# Patient Record
Sex: Female | Born: 1990
Health system: Southern US, Community
[De-identification: ages and names within clinical notes are randomized; demographics above are authoritative.]

## PROBLEM LIST (undated history)

## (undated) ENCOUNTER — Inpatient Hospital Stay (HOSPITAL_COMMUNITY): Payer: Self-pay

## (undated) DIAGNOSIS — F32A Depression, unspecified: Secondary | ICD-10-CM

## (undated) DIAGNOSIS — F419 Anxiety disorder, unspecified: Secondary | ICD-10-CM

## (undated) HISTORY — PX: WISDOM TOOTH EXTRACTION: SHX21

---

## 2015-06-03 HISTORY — PX: WISDOM TOOTH EXTRACTION: SHX21

## 2019-05-09 DIAGNOSIS — F4323 Adjustment disorder with mixed anxiety and depressed mood: Secondary | ICD-10-CM | POA: Diagnosis not present

## 2019-12-14 ENCOUNTER — Ambulatory Visit
Admission: EM | Admit: 2019-12-14 | Discharge: 2019-12-14 | Disposition: A | Discharge status: Home / Self Care | Payer: BC Managed Care – PPO | Attending: Physician Assistant | Admitting: Physician Assistant

## 2019-12-14 DIAGNOSIS — R21 Rash and other nonspecific skin eruption: Secondary | ICD-10-CM

## 2019-12-14 DIAGNOSIS — T63441A Toxic effect of venom of bees, accidental (unintentional), initial encounter: Secondary | ICD-10-CM

## 2019-12-14 MED ORDER — DEXAMETHASONE 4 MG PO TABS
4.0000 mg | ORAL_TABLET | Freq: Every day | ORAL | 0 refills | Status: DC
Start: 2019-12-14 — End: 2020-10-08

## 2019-12-14 NOTE — Discharge Instructions (Signed)
Ibuprofen 800mg  three times a day. Decadron pills as directed. Ice compress. Monitor for signs of infection including spreading redness, warmth, fever.

## 2019-12-14 NOTE — ED Provider Notes (Signed)
[facility]    CSN: [id number] Arrival date & time: 12/14/19  0851      History   Chief Complaint Chief Complaint  Patient presents with   Insect Bite    HPI Barbara Whitehead is a 29 y.o. female.   29 year old female comes in for multiple bee stings occurring yesterday. Was mowing the lawn when she was stung by multiple bees to the left wrist and bilateral ankles. Has pain, swelling, itching. Antihistamine, NSAIDs with some relief.      History reviewed. No pertinent past medical history.  There are no problems to display for this patient.   Past Surgical History:  Procedure Laterality Date   WISDOM TOOTH EXTRACTION      OB History   No obstetric history on file.      Home Medications    Prior to Admission medications   Medication Sig Start Date End Date Taking? Authorizing Provider  dexamethasone (DECADRON) 4 MG tablet Take 1 tablet (4 mg total) by mouth daily. 12/14/19   Belinda Fisher, PA-C    Family History History reviewed. No pertinent family history.  Social History Social History   Tobacco Use   Smoking status: Never Smoker   Smokeless tobacco: Never Used  Substance Use Topics   Alcohol use: Yes    Comment: social   Drug use: Not Currently     Allergies   Patient has no known allergies.   Review of Systems Review of Systems  Reason unable to perform ROS: See HPI as above.     Physical Exam Triage Vital Signs ED Triage Vitals [12/14/19 0903]  Enc Vitals Group     BP 123/61     Pulse Rate 86     Resp 18     Temp 98.9 F (37.2 C)     Temp Source Oral     SpO2 99 %     Weight      Height      Head Circumference      Peak Flow      Pain Score 5     Pain Loc      Pain Edu?      Excl. in GC?    No data found.  Updated Vital Signs BP 123/61 (BP Location: Left Arm)    Pulse 86    Temp 98.9 F (37.2 C) (Oral)    Resp 18    LMP 12/13/2019    SpO2 99%   Physical Exam Constitutional:      General: She is not in  acute distress.    Appearance: Normal appearance. She is well-developed. She is not toxic-appearing or diaphoretic.  HENT:     Head: Normocephalic and atraumatic.  Eyes:     Conjunctiva/sclera: Conjunctivae normal.     Pupils: Pupils are equal, round, and reactive to light.  Pulmonary:     Effort: Pulmonary effort is normal. No respiratory distress.     Comments: Speaking in full sentences without difficulty Musculoskeletal:     Cervical back: Normal range of motion and neck supple.     Comments: Swelling to the left wrist, bilateral ankles with warmth. No erythema. Full ROM of extremities. NVI  Skin:    General: Skin is warm and dry.  Neurological:     Mental Status: She is alert and oriented to person, place, and time.      UC Treatments / Results  Labs (all labs ordered are listed, but only abnormal results are  displayed) Labs Reviewed - No data to display  EKG   Radiology No results found.  Procedures Procedures (including critical care time)  Medications Ordered in UC Medications - No data to display  Initial Impression / Assessment and Plan / UC Course  I have reviewed the triage vital signs and the nursing notes.  Pertinent labs & imaging results that were available during my care of the patient were reviewed by me and considered in my medical decision making (see chart for details).     Will provide corticosteroid for symptomatic management. Patient had significant side effects with prednisone, will try decadron, can discontinue if still with significant side effects. Continue other symptomatic management. Return precautions given.  Final Clinical Impressions(s) / UC Diagnoses   Final diagnoses:  Rash  Bee sting, accidental or unintentional, initial encounter    ED Prescriptions    Medication Sig Dispense Auth. Provider   dexamethasone (DECADRON) 4 MG tablet Take 1 tablet (4 mg total) by mouth daily. 5 tablet Belinda Fisher, PA-C     PDMP not reviewed this  encounter.   Belinda Fisher, PA-C 12/14/19 [phone]

## 2019-12-14 NOTE — ED Triage Notes (Signed)
Pt states had 7 bee stings to lt wrist and bilateral ankles while mowing yesterday evening around 7pm. States has had a total of 3 benadryl with last one at 8pm, has had total of 4 ibuprofen with last dosage this am.

## 2020-02-15 DIAGNOSIS — Z03818 Encounter for observation for suspected exposure to other biological agents ruled out: Secondary | ICD-10-CM | POA: Diagnosis not present

## 2020-03-05 DIAGNOSIS — Z03818 Encounter for observation for suspected exposure to other biological agents ruled out: Secondary | ICD-10-CM | POA: Diagnosis not present

## 2020-05-22 DIAGNOSIS — F4323 Adjustment disorder with mixed anxiety and depressed mood: Secondary | ICD-10-CM | POA: Diagnosis not present

## 2020-06-05 DIAGNOSIS — F4323 Adjustment disorder with mixed anxiety and depressed mood: Secondary | ICD-10-CM | POA: Diagnosis not present

## 2020-06-26 DIAGNOSIS — F4323 Adjustment disorder with mixed anxiety and depressed mood: Secondary | ICD-10-CM | POA: Diagnosis not present

## 2020-07-03 DIAGNOSIS — F4323 Adjustment disorder with mixed anxiety and depressed mood: Secondary | ICD-10-CM | POA: Diagnosis not present

## 2020-07-10 DIAGNOSIS — Z23 Encounter for immunization: Secondary | ICD-10-CM | POA: Diagnosis not present

## 2020-07-10 DIAGNOSIS — Z0000 Encounter for general adult medical examination without abnormal findings: Secondary | ICD-10-CM | POA: Diagnosis not present

## 2020-07-11 DIAGNOSIS — F4323 Adjustment disorder with mixed anxiety and depressed mood: Secondary | ICD-10-CM | POA: Diagnosis not present

## 2020-07-16 DIAGNOSIS — F4323 Adjustment disorder with mixed anxiety and depressed mood: Secondary | ICD-10-CM | POA: Diagnosis not present

## 2020-07-17 DIAGNOSIS — Z0000 Encounter for general adult medical examination without abnormal findings: Secondary | ICD-10-CM | POA: Diagnosis not present

## 2020-07-17 DIAGNOSIS — Z13220 Encounter for screening for lipoid disorders: Secondary | ICD-10-CM | POA: Diagnosis not present

## 2020-07-18 DIAGNOSIS — M9903 Segmental and somatic dysfunction of lumbar region: Secondary | ICD-10-CM | POA: Diagnosis not present

## 2020-07-18 DIAGNOSIS — M9902 Segmental and somatic dysfunction of thoracic region: Secondary | ICD-10-CM | POA: Diagnosis not present

## 2020-07-18 DIAGNOSIS — M62830 Muscle spasm of back: Secondary | ICD-10-CM | POA: Diagnosis not present

## 2020-07-18 DIAGNOSIS — M9905 Segmental and somatic dysfunction of pelvic region: Secondary | ICD-10-CM | POA: Diagnosis not present

## 2020-07-24 DIAGNOSIS — M62830 Muscle spasm of back: Secondary | ICD-10-CM | POA: Diagnosis not present

## 2020-07-24 DIAGNOSIS — M9903 Segmental and somatic dysfunction of lumbar region: Secondary | ICD-10-CM | POA: Diagnosis not present

## 2020-07-24 DIAGNOSIS — M9905 Segmental and somatic dysfunction of pelvic region: Secondary | ICD-10-CM | POA: Diagnosis not present

## 2020-07-24 DIAGNOSIS — F4323 Adjustment disorder with mixed anxiety and depressed mood: Secondary | ICD-10-CM | POA: Diagnosis not present

## 2020-07-24 DIAGNOSIS — M9902 Segmental and somatic dysfunction of thoracic region: Secondary | ICD-10-CM | POA: Diagnosis not present

## 2020-07-31 DIAGNOSIS — M9902 Segmental and somatic dysfunction of thoracic region: Secondary | ICD-10-CM | POA: Diagnosis not present

## 2020-07-31 DIAGNOSIS — M9905 Segmental and somatic dysfunction of pelvic region: Secondary | ICD-10-CM | POA: Diagnosis not present

## 2020-07-31 DIAGNOSIS — M62830 Muscle spasm of back: Secondary | ICD-10-CM | POA: Diagnosis not present

## 2020-07-31 DIAGNOSIS — M9903 Segmental and somatic dysfunction of lumbar region: Secondary | ICD-10-CM | POA: Diagnosis not present

## 2020-08-01 DIAGNOSIS — F4323 Adjustment disorder with mixed anxiety and depressed mood: Secondary | ICD-10-CM | POA: Diagnosis not present

## 2020-08-07 DIAGNOSIS — M62830 Muscle spasm of back: Secondary | ICD-10-CM | POA: Diagnosis not present

## 2020-08-07 DIAGNOSIS — M9905 Segmental and somatic dysfunction of pelvic region: Secondary | ICD-10-CM | POA: Diagnosis not present

## 2020-08-07 DIAGNOSIS — M9903 Segmental and somatic dysfunction of lumbar region: Secondary | ICD-10-CM | POA: Diagnosis not present

## 2020-08-07 DIAGNOSIS — M9902 Segmental and somatic dysfunction of thoracic region: Secondary | ICD-10-CM | POA: Diagnosis not present

## 2020-08-14 DIAGNOSIS — M62830 Muscle spasm of back: Secondary | ICD-10-CM | POA: Diagnosis not present

## 2020-08-14 DIAGNOSIS — M9902 Segmental and somatic dysfunction of thoracic region: Secondary | ICD-10-CM | POA: Diagnosis not present

## 2020-08-14 DIAGNOSIS — M9903 Segmental and somatic dysfunction of lumbar region: Secondary | ICD-10-CM | POA: Diagnosis not present

## 2020-08-14 DIAGNOSIS — M9905 Segmental and somatic dysfunction of pelvic region: Secondary | ICD-10-CM | POA: Diagnosis not present

## 2020-08-15 DIAGNOSIS — F4323 Adjustment disorder with mixed anxiety and depressed mood: Secondary | ICD-10-CM | POA: Diagnosis not present

## 2020-08-23 DIAGNOSIS — F4323 Adjustment disorder with mixed anxiety and depressed mood: Secondary | ICD-10-CM | POA: Diagnosis not present

## 2020-09-03 DIAGNOSIS — F4323 Adjustment disorder with mixed anxiety and depressed mood: Secondary | ICD-10-CM | POA: Diagnosis not present

## 2020-09-07 DIAGNOSIS — Z03818 Encounter for observation for suspected exposure to other biological agents ruled out: Secondary | ICD-10-CM | POA: Diagnosis not present

## 2020-09-13 DIAGNOSIS — F4323 Adjustment disorder with mixed anxiety and depressed mood: Secondary | ICD-10-CM | POA: Diagnosis not present

## 2020-09-19 DIAGNOSIS — F4323 Adjustment disorder with mixed anxiety and depressed mood: Secondary | ICD-10-CM | POA: Diagnosis not present

## 2020-09-27 DIAGNOSIS — F4323 Adjustment disorder with mixed anxiety and depressed mood: Secondary | ICD-10-CM | POA: Diagnosis not present

## 2020-10-04 DIAGNOSIS — F4323 Adjustment disorder with mixed anxiety and depressed mood: Secondary | ICD-10-CM | POA: Diagnosis not present

## 2020-10-08 ENCOUNTER — Other Ambulatory Visit: Payer: Self-pay

## 2020-10-08 ENCOUNTER — Encounter (HOSPITAL_BASED_OUTPATIENT_CLINIC_OR_DEPARTMENT_OTHER): Payer: Self-pay | Admitting: Obstetrics & Gynecology

## 2020-10-08 ENCOUNTER — Ambulatory Visit (INDEPENDENT_AMBULATORY_CARE_PROVIDER_SITE_OTHER): Payer: BC Managed Care – PPO | Admitting: Obstetrics & Gynecology

## 2020-10-08 VITALS — BP 145/91 | HR 107 | Ht 64.25 in | Wt 158.0 lb

## 2020-10-08 DIAGNOSIS — N942 Vaginismus: Secondary | ICD-10-CM

## 2020-10-08 DIAGNOSIS — N941 Unspecified dyspareunia: Secondary | ICD-10-CM

## 2020-10-08 DIAGNOSIS — Z124 Encounter for screening for malignant neoplasm of cervix: Secondary | ICD-10-CM

## 2020-10-08 NOTE — Progress Notes (Signed)
30 y.o. G0P0000 Married White or Caucasian female here for annual exam.  Cycles are regular.  Started cycles in about the 7th grade.  Flow lasts 5 days.  First two days are heavy.  Does pass clots at times.  Has attempted intercourse.  No trauma that she's aware of but states she has wondered.  She can place a tampon.  Doesn't use tampons often but does if she's going to the beach or the pool and is on her cycle.   She's married for six years.  States spouse is really great.  Says several derogatory statements about herself.  When asked, states she is not interested in couples therapy at this point.    Not sure she wants to have children.    Has never had a gyn exam.  Has never had a Pap smear.  Patient's last menstrual period was 09/09/2020.          Sexually active: not currently The current method of family planning is abstinence.    Exercising: Yes.    runing and walking Smoker:  no  Health Maintenance: Pap:  never Vaccines:  Has completed the Covid vaccine.  Unsure of others.   reports that she has never smoked. She has never used smokeless tobacco. She reports current alcohol use. She reports previous drug use.  History reviewed. No pertinent past medical history.  Past Surgical History:  Procedure Laterality Date  . WISDOM TOOTH EXTRACTION  2017    No current outpatient medications on file.   No current facility-administered medications for this visit.    Family History  Problem Relation Age of Onset  . Arthritis Mother   . Depression Mother   . Sarcoidosis Father   . Depression Father     Review of Systems  All other systems reviewed and are negative.   Exam:   BP (!) 145/91   Pulse (!) 107   Ht 5' 4.25" (1.632 m)   Wt 158 lb (71.7 kg)   LMP 09/09/2020   BMI 26.91 kg/m   Height: 5' 4.25" (163.2 cm)  General appearance: alert, cooperative and appears stated age Head: Normocephalic, without obvious abnormality, atraumatic Neck: no adenopathy, supple,  symmetrical, trachea midline and thyroid normal to inspection and palpation Lungs: clear to auscultation bilaterally Breasts: normal appearance, no masses or tenderness Heart: regular rate and rhythm Abdomen: soft, non-tender; bowel sounds normal; no masses,  no organomegaly Extremities: extremities normal, atraumatic, no cyanosis or edema Skin: Skin color, texture, turgor normal. No rashes or lesions Lymph nodes: Cervical, supraclavicular, and axillary nodes normal. No abnormal inguinal nodes palpated Neurologic: Grossly normal   Pelvic: External genitalia:  no lesions              Urethra:  normal appearing urethra with no masses, tenderness or lesions              Bartholins and Skenes: normal                 Vagina: attempted placement of small speculum without success.  Pt reports significant pain and cries with this attempt.  Stopped due to her discomfort.   Declined chaperone.  Assessment/Plan: 1. Dyspareunia, female - Ambulatory referral to Physical Therapy - discussed with pt she may need a more lengthy physical therapy due to severity of her symptoms - I think she will benefit from therapy as well.  2. Vaginismus  3.  Needs updated medical care - release of records for vaccines signed today - pt  will need to return for repeat attempt at Pap smear  Total time with pt:  49 minutes

## 2020-10-09 ENCOUNTER — Encounter (HOSPITAL_BASED_OUTPATIENT_CLINIC_OR_DEPARTMENT_OTHER): Payer: Self-pay | Admitting: Obstetrics & Gynecology

## 2020-10-09 DIAGNOSIS — N941 Unspecified dyspareunia: Secondary | ICD-10-CM | POA: Insufficient documentation

## 2020-10-09 DIAGNOSIS — N942 Vaginismus: Secondary | ICD-10-CM | POA: Insufficient documentation

## 2020-10-10 ENCOUNTER — Encounter (HOSPITAL_BASED_OUTPATIENT_CLINIC_OR_DEPARTMENT_OTHER): Payer: Self-pay

## 2020-10-10 NOTE — Telephone Encounter (Signed)
Called pt personally and gave pt information about [facility], Jeanie Cooks who does couples/sex therapy, and [employer].  Pt voiced appreciation for phone call.

## 2020-10-11 ENCOUNTER — Ambulatory Visit (HOSPITAL_BASED_OUTPATIENT_CLINIC_OR_DEPARTMENT_OTHER): Payer: BC Managed Care – PPO | Admitting: Obstetrics & Gynecology

## 2020-10-12 DIAGNOSIS — Z03818 Encounter for observation for suspected exposure to other biological agents ruled out: Secondary | ICD-10-CM | POA: Diagnosis not present

## 2020-10-17 DIAGNOSIS — F4323 Adjustment disorder with mixed anxiety and depressed mood: Secondary | ICD-10-CM | POA: Diagnosis not present

## 2020-10-23 DIAGNOSIS — F419 Anxiety disorder, unspecified: Secondary | ICD-10-CM | POA: Diagnosis not present

## 2020-10-25 DIAGNOSIS — F4323 Adjustment disorder with mixed anxiety and depressed mood: Secondary | ICD-10-CM | POA: Diagnosis not present

## 2020-11-07 DIAGNOSIS — F4323 Adjustment disorder with mixed anxiety and depressed mood: Secondary | ICD-10-CM | POA: Diagnosis not present

## 2020-11-08 DIAGNOSIS — F419 Anxiety disorder, unspecified: Secondary | ICD-10-CM | POA: Diagnosis not present

## 2020-11-13 DIAGNOSIS — F419 Anxiety disorder, unspecified: Secondary | ICD-10-CM | POA: Diagnosis not present

## 2020-11-20 ENCOUNTER — Ambulatory Visit: Payer: BC Managed Care – PPO | Attending: Obstetrics & Gynecology | Admitting: Physical Therapy

## 2020-11-20 ENCOUNTER — Other Ambulatory Visit: Payer: Self-pay

## 2020-11-20 ENCOUNTER — Encounter: Payer: Self-pay | Admitting: Physical Therapy

## 2020-11-20 DIAGNOSIS — R2689 Other abnormalities of gait and mobility: Secondary | ICD-10-CM | POA: Diagnosis not present

## 2020-11-20 DIAGNOSIS — R252 Cramp and spasm: Secondary | ICD-10-CM | POA: Insufficient documentation

## 2020-11-20 DIAGNOSIS — M6281 Muscle weakness (generalized): Secondary | ICD-10-CM | POA: Insufficient documentation

## 2020-11-20 NOTE — Patient Instructions (Addendum)
  Access Code: [email or web address] URL: [email or web address] Date: 11/20/2020 Prepared by: Otelia Sergeant  Exercises Supine Diaphragmatic Breathing - 1 x daily - 7 x weekly - 2 sets - 10 reps Modified Thomas Stretch - 1 x daily - 7 x weekly - 3 sets - 10 reps Supine Piriformis Stretch Pulling Heel to Hip - 1 x daily - 7 x weekly - 3 sets - 10 reps Supine Hip Adductor Stretch - 1 x daily - 7 x weekly - 3 sets - 10 reps Seated Hamstring Stretch - 1 x daily - 7 x weekly - 3 sets - 10 reps     Moisturizers They are used in the vagina to hydrate the mucous membrane that make up the vaginal canal. Designed to keep a more normal acid balance (ph) Once placed in the vagina, it will last between two to three days.  Use 2-3 times per week at bedtime  Ingredients to avoid is glycerin and fragrance, can increase chance of infection Should not be used just before sex due to causing irritation Most are gels administered either in a tampon-shaped applicator or as a vaginal suppository. They are non-hormonal.   Types of Moisturizers(internal use)  Vitamin E vaginal suppositories- Whole foods, Amazon Moist Again Coconut oil- can break down condoms Julva- (Do no use if on Tamoxifen) amazon Yes moisturizer- amazon NeuEve Silk , NeuEve Silver for menopausal or over 65 (if have severe vaginal atrophy or cancer treatments use NeuEve Silk for  1 month than move to [employer])- [employer], [place].com Olive and Bee intimate cream- www.oliveandbee.com.au Mae vaginal moisturizer- Amazon Aloe    Creams to use externally on the Vulva area Marathon Oil (good for for cancer patients that had radiation to the area)- [place] or [employer].https://garcia-[email or web address]/ V-magic cream - amazon Julva-amazon Vital "V Wild Yam salve ( help moisturize and help with thinning vulvar area, does have Beeswax MoodMaid Botanical Pro-Meno Wild Yam Cream- Amazon Desert Harvest Gele Cleo by Zane Herald labial moisturizer (Amazon,   Coconut or olive oil aloe   Things to avoid in the vaginal area Do not use things to irritate the vulvar area No lotions just specialized creams for the vulva area- Neogyn, V-magic, No soaps; can use Aveeno or Calendula cleanser if needed. Must be gentle No deodorants No douches Good to sleep without underwear to let the vaginal area to air out No scrubbing: spread the lips to let warm water rinse over labias and pat dry

## 2020-11-20 NOTE — Therapy (Signed)
[facility] [address] [place], [place], [address] Phone: [phone]   Fax:  [phone]  Physical Therapy Evaluation  Patient Details  Name: Barbara Whitehead MRN: [id number] Date of Birth: 1990/07/13 Referring Provider (PT): Jerene Bears, MD   Encounter Date: 11/20/2020   PT End of Session - 11/20/20 0810     Visit Number 1    Number of Visits 27    Authorization Type BCBS    Progress Note Due on Visit 10    PT Start Time 0800    PT Stop Time 0844    PT Time Calculation (min) 44 min    Activity Tolerance Patient tolerated treatment well    Behavior During Therapy [facility] for tasks assessed/performed             History reviewed. No pertinent past medical history.  Past Surgical History:  Procedure Laterality Date   WISDOM TOOTH EXTRACTION  2017    There were no vitals filed for this visit.    Subjective Assessment - 11/20/20 0803     Subjective pt reports pain with vaginal pentration, and this has been going on "as long as I can remember".    Patient Stated Goals to have less pain    Currently in Pain? No/denies    Multiple Pain Sites No                [facility] PT Assessment - 11/20/20 0001       Assessment   Medical Diagnosis N94.10 (ICD-10-CM) - Dyspareunia, female. N94.2 (ICD-10-CM) - Vaginismus    Referring Provider (PT) Jerene Bears, MD    Onset Date/Surgical Date --   chronic, years   Prior Therapy none      Precautions   Precautions None      Restrictions   Weight Bearing Restrictions No      Balance Screen   Has the patient fallen in the past 6 months No      Home Environment   Living Environment Private residence    Type of Home House      Prior Function   Level of Independence Independent      Cognition   Overall Cognitive Status Within Functional Limits for tasks assessed      Sensation   Light Touch Appears Intact      Coordination   Gross Motor Movements  are Fluid and Coordinated Yes      Posture/Postural Control   Posture/Postural Control Postural limitations    Postural Limitations Rounded Shoulders      ROM / Strength   AROM / PROM / Strength AROM;PROM;Strength      AROM   Overall AROM  Deficits    Overall AROM Comments R and L hamstring, adduction and ER limited by 25% all else EFL    AROM Assessment Site Hip;Lumbar    Right/Left Hip Right;Left      PROM   Overall PROM  Within functional limits for tasks performed      Strength   Overall Strength Within functional limits for tasks performed    Strength Assessment Site Hip;Knee;Ankle;Lumbar      Flexibility   Soft Tissue Assessment /Muscle Length yes    Hamstrings limited by 25% BLE    Piriformis limited by 25% BLE      Palpation   Palpation comment tenderness noted at B bulbocavernosus, superficial transverse abd ischiocavernosus, and Rt externally assessed obturator internus      Ambulation/Gait  Ambulation/Gait Yes    Ambulation/Gait Assistance 7: Independent    Assistive device None                        Objective measurements completed on examination: See above findings.     Pelvic Floor Special Questions - 11/20/20 0001     Prior Pelvic/Prostate Exam --   pt unable to tolerate internal pelvic exam previously, n oprevious PAP Smear   Are you Pregnant or attempting pregnancy? No    Prior Pregnancies No    Currently Sexually Active No    Urinary Leakage No    Fecal incontinence No    Skin Integrity --   mild dryness noted externally   Pelvic Floor Internal Exam pt identified, patient confirms consent for PT to perform inernal soft tissue work and muscle strength and integrity assessment    Exam Type Deferred    Palpation (+) Q-tip test, externally, all internall work deferred as pt reported pain/tenderness with Q-tip at external tissues                      PT Education - 11/20/20 0859     Education Details pt educated on  HEP, things to avoid for vaginal dryness, diaphragmatic breathing and pt requested techniques for her partner to assist with pt educated on including pt's partner as she feels comfortable and she agreed.    Person(s) Educated Patient    Methods Explanation;Demonstration;Handout    Comprehension Verbalized understanding;Returned demonstration              PT Short Term Goals - 11/20/20 0909       PT SHORT TERM GOAL #1   Title pt to be I with HEP    Time 4    Period Weeks    Target Date 12/18/20      PT SHORT TERM GOAL #2   Title pt to demonstrate improved ROM at Bil hamstrings and external rotation to no more than 15% limitations    Baseline 25% limitations    Time 4    Period Weeks    Status New    Target Date 12/18/20      PT SHORT TERM GOAL #3   Title pt to report improved tolerance to no more than 3/10 pain to level one dialator with penetration vaginally    Time 4    Period Weeks    Status New    Target Date 12/18/20               PT Long Term Goals - 11/20/20 0911       PT LONG TERM GOAL #1   Title pt to be I with advanced HEP    Time 8    Period Weeks    Status New    Target Date 01/15/21      PT LONG TERM GOAL #2   Title pt to report no more than 4/10 pain with level 3 dialator with vaginal penetration    Time 8    Period Weeks    Status New    Target Date 01/15/21      PT LONG TERM GOAL #3   Title pt to demonstrate no more than 10% ROM limtations at Bil hip external rotation and hamstrings    Time 8    Period Weeks    Status New    Target Date 01/15/21  Plan - 11/20/20 0811     Clinical Impression Statement Pt is 30 yo female presenting to clinic this date with reports of chronic pelvic pain with initial vaginal penetration. Pt has been unable to have penetrative intercourse due to this. Pt reports she has always had this pain and is limited in personal life secondary to this. Pt found to have (+) Q-tip test  with external tissues at vulva, noted tenderness with external palpation at bulbocavernous, ischipcavernosus, supervicial transverse, Rt obturatus inturnus. Pt also found to have tightness in B adductors, hamstrings, and hip external rotations Rt>Lt. Pt reported she felt anxious about penetration vaginally and thinks in addition to pain and tightness in pelvis this contributes. Pt given HEP for diaphragmatic breathing, B hip stretches and lubration for improved self care and stretching at vulva. Pt reports she is very motivated to improve tissue mobility and decrease pain levels. Pt left with all questions answered and no additional concerns named. Pt would benefit from continued PT for improved mobility, decreased pain, improved QOL and stretching and strengthening.    Personal Factors and Comorbidities Sex    Examination-Activity Limitations Other    Examination-Participation Restrictions Interpersonal Relationship    Stability/Clinical Decision Making Stable/Uncomplicated    Rehab Potential Good    PT Frequency 1x / week    PT Duration 8 weeks    PT Treatment/Interventions ADLs/Self Care Home Management;Functional mobility training;Therapeutic activities;Neuromuscular re-education;Manual techniques;Patient/family education;Passive range of motion;Dry needling;Energy conservation    PT Next Visit Plan go over HEP, diapragmatic breathing with activity, manual work externally at hips/glutes    PT Home Exercise Plan Access Code [facility]    Consulted and Agree with Plan of Care Patient             Patient will benefit from skilled therapeutic intervention in order to improve the following deficits and impairments:  Decreased coordination, Decreased endurance, Decreased activity tolerance, Decreased mobility, Decreased strength, Postural dysfunction, Improper body mechanics, Impaired flexibility, Decreased range of motion, Impaired tone  Visit Diagnosis: Cramp and spasm - Plan: PT plan of care  cert/re-cert  Muscle weakness (generalized) - Plan: PT plan of care cert/re-cert  Other abnormalities of gait and mobility - Plan: PT plan of care cert/re-cert     Problem List Patient Active Problem List   Diagnosis Date Noted   Dyspareunia, female 10/09/2020   Vaginismus 10/09/2020    Otelia Sergeant, PT 06/21/229:20 AM   [facility] [address], [address] [place], [place], [address] Phone: [phone]   Fax:  [phone]  Name: Jasmyn Picha MRN: [id number] Date of Birth: [birth year]

## 2020-11-21 DIAGNOSIS — F4323 Adjustment disorder with mixed anxiety and depressed mood: Secondary | ICD-10-CM | POA: Diagnosis not present

## 2020-11-26 ENCOUNTER — Encounter: Payer: Self-pay | Admitting: Physical Therapy

## 2020-11-26 ENCOUNTER — Other Ambulatory Visit: Payer: Self-pay

## 2020-11-26 ENCOUNTER — Ambulatory Visit: Payer: BC Managed Care – PPO | Admitting: Physical Therapy

## 2020-11-26 DIAGNOSIS — R252 Cramp and spasm: Secondary | ICD-10-CM | POA: Diagnosis not present

## 2020-11-26 DIAGNOSIS — M6281 Muscle weakness (generalized): Secondary | ICD-10-CM

## 2020-11-26 DIAGNOSIS — R2689 Other abnormalities of gait and mobility: Secondary | ICD-10-CM | POA: Diagnosis not present

## 2020-11-26 NOTE — Patient Instructions (Signed)
Pelvic Floor Vaginal dilators                                                             Amielle Restore Vaginal Dilator Kit                                                        Vulva Tech                                                                              Restore                                                                                                                   Soul Source                                                                                    Intimate Rose                                                                                        Inspire Silicone Dilator Set                                                                                 V Well  dilator set                                                                                             Milli Dilator that you pump                                                                               Berman dilator                                                                                             S[place] dilators                                                                  Vaginismus Vaginal dilators                                                    Oh Nut for deep vaginal penetration limitation  Most of these dilators you can get on Amazon. The ones you are not able to do then look at the company website or [email or web address]   

## 2020-11-26 NOTE — Therapy (Signed)
[facility] [address] [place], [place], [address] Phone: [phone]   Fax:  [phone]  Physical Therapy Treatment  Patient Details  Name: Barbara Whitehead MRN: [id number] Date of Birth: [birth year] Referring Provider (PT): Jerene Bears, MD   Encounter Date: 11/26/2020   PT End of Session - 11/26/20 0915     Visit Number 2    Authorization Type BCBS    PT Start Time 0801    PT Stop Time 0846    PT Time Calculation (min) 45 min    Activity Tolerance Patient tolerated treatment well;No increased pain    Behavior During Therapy [facility] for tasks assessed/performed             History reviewed. No pertinent past medical history.  Past Surgical History:  Procedure Laterality Date   WISDOM TOOTH EXTRACTION  2017    There were no vitals filed for this visit.   Subjective Assessment - 11/26/20 0805     Subjective pt reports she has been doing HEP without complaints, meditation was helpful and reports her husband has participated in exercises and stretching.    Patient Stated Goals to have less pain    Currently in Pain? No/denies    Multiple Pain Sites No                               [facility] Adult PT Treatment/Exercise - 11/26/20 0001       Neuro Re-ed    Neuro Re-ed Details  diaphragmatic breathing in hooklying with purpose to coordinate with relaxation/contraction of pelvic floor. noted externally as pt unable to tolerate any internal exam. verbal and tactile cues to limit recruitment of glutes and/or lower abdominals with pelvic floor contraction.      Exercises   Exercises Knee/Hip      Knee/Hip Exercises: Stretches   Piriformis Stretch Both;2 reps;Other (comment)   45s   Other Knee/Hip Stretches hook lying adductor: knee fallouts 2x45s each side    Other Knee/Hip Stretches internal and external rotation stretches with gentle overpress from therapist 2x45s      Manual Therapy    Manual Therapy Soft tissue mobilization;Myofascial release    Soft tissue mobilization lower abdominal massage into public bone region with suction cup assist for improved mobility of tissues and decreased fasica tightness,    Myofascial Release Bil adductors and externally at bulbocavernosus and ischiocavernous with trigger points noted and pt reporting relief with this                    PT Education - 11/26/20 0857     Education Details Access Code [email or web address]. Pt educated on continuing HEP, she enjoys yoga and follows a [employer] which has a hip flow pt educated to attempt this and continue diaphragmatic breathing with activity, pt also educated on dialator use with her being very aggreeable and insterested in purhasing small size. Pt educated on pending her comfort and pain levels to begin externally, and progressing to at vaginal opening and into vaginal opening if comfortable. Pt educated on halting activity regardless of external or internal with dialator if pain is higher than 4/10. Pt also demonstrated with use of pelvic model and handout on placement and technique with dialator and sizing and where to purchase or search for purchase. Pt reported understanding and agreement at end of session.    Person(s) Educated Patient  Methods Explanation;Demonstration;Verbal cues    Comprehension Verbalized understanding;Returned demonstration              PT Short Term Goals - 11/20/20 0909       PT SHORT TERM GOAL #1   Title pt to be I with HEP    Time 4    Period Weeks    Target Date 12/18/20      PT SHORT TERM GOAL #2   Title pt to demonstrate improved ROM at Bil hamstrings and external rotation to no more than 15% limitations    Baseline 25% limitations    Time 4    Period Weeks    Status New    Target Date 12/18/20      PT SHORT TERM GOAL #3   Title pt to report improved tolerance to no more than 3/10 pain to level one dialator with penetration  vaginally    Time 4    Period Weeks    Status New    Target Date 12/18/20               PT Long Term Goals - 11/20/20 0911       PT LONG TERM GOAL #1   Title pt to be I with advanced HEP    Time 8    Period Weeks    Status New    Target Date 01/15/21      PT LONG TERM GOAL #2   Title pt to report no more than 4/10 pain with level 3 dialator with vaginal penetration    Time 8    Period Weeks    Status New    Target Date 01/15/21      PT LONG TERM GOAL #3   Title pt to demonstrate no more than 10% ROM limtations at Bil hip external rotation and hamstrings    Time 8    Period Weeks    Status New    Target Date 01/15/21                   Plan - 11/26/20 0915     Clinical Impression Statement Pt presenting to clinic today, denied any pelvic pain outside of attempting vaginal pentration however she has not attempted this since last session. Pt reported she has continued HEP at home without pain or discomfort and has tried to implement deep breathing with activity which has improved her anixety in her opinion. Pt found to have continued tightness in Bil hips during session and lead in stretching for increased mobility at adductors and internal and external hip rotators. Pt also found to have slight lower abdominal tightness and tenderness noted at pubic bone and lead in fasical release with suction cup to assist with good effect and pt reporting no tenderness at end of this. Pt also directed in manual work externally over clothing at ischiocavernosus and bulbocavernous with noted tenderness with light touch initially however with soft tissue release pt reported this improved. Pt educated on use of vaginal dialators starting at smallest size during session and explain in depth in education. Pt denied additional questions at end of session. Pt would benefit from continued PT for improved mobility, decreased pain, improved QOL and stretching and strengthening.    Personal  Factors and Comorbidities Sex    Examination-Activity Limitations Other    Examination-Participation Restrictions Interpersonal Relationship    Stability/Clinical Decision Making Stable/Uncomplicated    Clinical Decision Making Low    Rehab Potential Good  PT Frequency 1x / week    PT Duration 8 weeks    PT Treatment/Interventions ADLs/Self Care Home Management;Functional mobility training;Therapeutic activities;Neuromuscular re-education;Manual techniques;Patient/family education;Passive range of motion;Dry needling;Energy conservation    PT Next Visit Plan go over HEP, diapragmatic breathing with activity, manual work externally at hips/glutes    PT Home Exercise Plan Access Code [facility]    Consulted and Agree with Plan of Care Patient             Patient will benefit from skilled therapeutic intervention in order to improve the following deficits and impairments:  Decreased coordination, Decreased endurance, Decreased activity tolerance, Decreased mobility, Decreased strength, Postural dysfunction, Improper body mechanics, Impaired flexibility, Decreased range of motion, Impaired tone  Visit Diagnosis: Cramp and spasm  Other abnormalities of gait and mobility  Muscle weakness (generalized)     Problem List Patient Active Problem List   Diagnosis Date Noted   Dyspareunia, female 10/09/2020   Vaginismus 10/09/2020    Barbaraann Faster 11/26/2020, 9:25 AM  [facility] [address] [place], [place], [address] Phone: [phone]   Fax:  [phone]  Name: Barbara Whitehead MRN: [id number] Date of Birth: 1991/05/15

## 2020-11-28 DIAGNOSIS — F419 Anxiety disorder, unspecified: Secondary | ICD-10-CM | POA: Diagnosis not present

## 2020-12-05 DIAGNOSIS — F419 Anxiety disorder, unspecified: Secondary | ICD-10-CM | POA: Diagnosis not present

## 2020-12-05 DIAGNOSIS — F4323 Adjustment disorder with mixed anxiety and depressed mood: Secondary | ICD-10-CM | POA: Diagnosis not present

## 2020-12-11 ENCOUNTER — Encounter: Payer: Self-pay | Admitting: Physical Therapy

## 2020-12-11 ENCOUNTER — Ambulatory Visit: Payer: BC Managed Care – PPO | Attending: Obstetrics & Gynecology | Admitting: Physical Therapy

## 2020-12-11 ENCOUNTER — Other Ambulatory Visit: Payer: Self-pay

## 2020-12-11 DIAGNOSIS — R279 Unspecified lack of coordination: Secondary | ICD-10-CM | POA: Diagnosis not present

## 2020-12-11 DIAGNOSIS — R252 Cramp and spasm: Secondary | ICD-10-CM | POA: Diagnosis not present

## 2020-12-11 DIAGNOSIS — R2689 Other abnormalities of gait and mobility: Secondary | ICD-10-CM | POA: Diagnosis not present

## 2020-12-11 DIAGNOSIS — F419 Anxiety disorder, unspecified: Secondary | ICD-10-CM | POA: Diagnosis not present

## 2020-12-11 NOTE — Therapy (Signed)
[facility] [address] [place], [place], [address] Phone: [phone]   Fax:  [phone]  Physical Therapy Treatment  Patient Details  Name: Barbara Whitehead MRN: [id number] Date of Birth: 1990-11-27 Referring Provider (PT): Jerene Bears, MD   Encounter Date: 12/11/2020   PT End of Session - 12/11/20 0851     Visit Number 3    Authorization Type BCBS    Authorization - Visit Number 3    Authorization - Number of Visits 60    PT Start Time 0803    PT Stop Time 0849    PT Time Calculation (min) 46 min    Activity Tolerance Patient tolerated treatment well;No increased pain    Behavior During Therapy [facility] for tasks assessed/performed             History reviewed. No pertinent past medical history.  Past Surgical History:  Procedure Laterality Date   WISDOM TOOTH EXTRACTION  2017    There were no vitals filed for this visit.   Subjective Assessment - 12/11/20 0808     Subjective Pt reports she ordered dialators but haven't arrived yet, and continues complete HEP.    Patient Stated Goals to have less pain    Multiple Pain Sites No                            Pelvic Floor Special Questions - 12/11/20 0001     External Palpation tenderness at superficial transverse, ischiocavernosis, bulbcavernosus    Pelvic Floor Internal Exam pt identified, patient confirms consent for PT to perform inernal soft tissue work and muscle strength and integrity assessment    Exam Type Deferred    Sensation pt unable to tolerate internal work at this time 2/2 pain. not attempted this visit.               [facility] Adult PT Treatment/Exercise - 12/11/20 0001       Manual Therapy   Manual Therapy Soft tissue mobilization;Myofascial release    Soft tissue mobilization Bil adductors and externally at bulbocavernosus and ischiocavernous with trigger points noted and pt reporting relief with this     Myofascial Release Bil adductors and externally at bulbocavernosus and ischiocavernous with trigger points noted and pt reporting relief with this                    PT Education - 12/11/20 0849     Education Details Access Code [email or web address]. Pt educated on additions to HEP and given these for home, additional relaxation based videos at same site as previously given pelvic floor meditation video and additional feedback for diaphragmatic breathing with activity.    Person(s) Educated Patient    Methods Explanation;Demonstration;Tactile cues;Verbal cues    Comprehension Verbalized understanding              PT Short Term Goals - 12/11/20 0858       PT SHORT TERM GOAL #1   Title pt to be I with HEP    Time 4    Period Weeks    Status Achieved    Target Date 12/18/20      PT SHORT TERM GOAL #2   Title pt to demonstrate improved ROM at Bil hamstrings and external rotation to no more than 15% limitations    Baseline 25% limitations    Time 4    Period Weeks    Status On-going  Target Date 12/18/20      PT SHORT TERM GOAL #3   Title pt to report improved tolerance to no more than 3/10 pain to level one dialator with penetration vaginally for improved tolerance to vaginal exam    Time 6    Period Weeks    Status On-going    Target Date 01/01/21               PT Long Term Goals - 11/20/20 0911       PT LONG TERM GOAL #1   Title pt to be I with advanced HEP    Time 8    Period Weeks    Status New    Target Date 01/15/21      PT LONG TERM GOAL #2   Title pt to report no more than 4/10 pain with level 3 dialator with vaginal penetration    Time 8    Period Weeks    Status New    Target Date 01/15/21      PT LONG TERM GOAL #3   Title pt to demonstrate no more than 10% ROM limtations at Bil hip external rotation and hamstrings    Time 8    Period Weeks    Status New    Target Date 01/15/21                   Plan - 12/11/20 0851      Clinical Impression Statement Pt presenting to clinic with reports of continued feeling of pelvic tightness, hip tightness but pelvic pain unless attempting vaginal penetration however this has been some time since attempted this. Pt has continued HEP and given additional spine stretches and hip stretches for home and pt required additional cues for correct breathing pattern this session as well as for improved "pelvic drops" with breathing to promote increased relaxation. Pt found to have great tightness in Bil adductors manual work to this area for trigger point release at multiple sites more so at proximal attachment with improvement noted by pt with feeling of relief and decreased tenderness, pt also reported slight tenderness with palpation externally at ischiocavernosus, bulbocavernosus, and superficial transverse with manual work here as well for relaxation and decreased trigger points with good effect. Pt reported decreased tenderness after this. Pt educated again on vaginal dialators once they arrive, if pt is comfortable she may bring in at next appointment.    Personal Factors and Comorbidities Time since onset of injury/illness/exacerbation;Sex    Examination-Participation Restrictions Interpersonal Relationship    Stability/Clinical Decision Making Stable/Uncomplicated    Clinical Decision Making Low    Rehab Potential Good    PT Frequency 1x / week    PT Duration 8 weeks    PT Treatment/Interventions ADLs/Self Care Home Management;Functional mobility training;Therapeutic activities;Neuromuscular re-education;Manual techniques;Patient/family education;Passive range of motion;Dry needling;Energy conservation    PT Next Visit Plan diapragmatic breathing with activity, manual work externally at hips/glutes, go over dialators and use    PT Home Exercise Plan Access Code [email or web address]    Consulted and Agree with Plan of Care Patient             Patient will benefit from skilled therapeutic  intervention in order to improve the following deficits and impairments:  Decreased coordination, Decreased endurance, Decreased activity tolerance, Decreased mobility, Decreased strength, Postural dysfunction, Improper body mechanics, Impaired flexibility, Decreased range of motion, Impaired tone  Visit Diagnosis: Unspecified lack of coordination  Cramp and spasm     Problem  List Patient Active Problem List   Diagnosis Date Noted   Dyspareunia, female 10/09/2020   Vaginismus 10/09/2020    Otelia Sergeant, PT 07/12/229:08 AM   [facility] [address] [place], [place], [address] Phone: [phone]   Fax:  [phone]  Name: Jasie Meleski MRN: [id number] Date of Birth: [birth year]

## 2020-12-12 DIAGNOSIS — F4323 Adjustment disorder with mixed anxiety and depressed mood: Secondary | ICD-10-CM | POA: Diagnosis not present

## 2020-12-18 DIAGNOSIS — F419 Anxiety disorder, unspecified: Secondary | ICD-10-CM | POA: Diagnosis not present

## 2020-12-25 ENCOUNTER — Ambulatory Visit: Payer: BC Managed Care – PPO | Admitting: Physical Therapy

## 2020-12-25 ENCOUNTER — Other Ambulatory Visit: Payer: Self-pay

## 2020-12-25 ENCOUNTER — Encounter: Payer: Self-pay | Admitting: Physical Therapy

## 2020-12-25 DIAGNOSIS — R252 Cramp and spasm: Secondary | ICD-10-CM

## 2020-12-25 DIAGNOSIS — F419 Anxiety disorder, unspecified: Secondary | ICD-10-CM | POA: Diagnosis not present

## 2020-12-25 DIAGNOSIS — R279 Unspecified lack of coordination: Secondary | ICD-10-CM | POA: Diagnosis not present

## 2020-12-25 DIAGNOSIS — R2689 Other abnormalities of gait and mobility: Secondary | ICD-10-CM | POA: Diagnosis not present

## 2020-12-25 NOTE — Therapy (Signed)
[facility] [address] [place], [place], [address] Phone: [phone]   Fax:  [phone]  Physical Therapy Treatment  Patient Details  Name: Barbara Whitehead MRN: [id number] Date of Birth: 1991/05/23 Referring Provider (PT): Jerene Bears, MD   Encounter Date: 12/25/2020   PT End of Session - 12/25/20 0826     Visit Number 4    Authorization Type BCBS    Authorization - Visit Number 4    Authorization - Number of Visits 60    PT Start Time 0801    PT Stop Time 0847    PT Time Calculation (min) 46 min    Activity Tolerance Patient tolerated treatment well;No increased pain    Behavior During Therapy [facility] for tasks assessed/performed             History reviewed. No pertinent past medical history.  Past Surgical History:  Procedure Laterality Date   WISDOM TOOTH EXTRACTION  2017    There were no vitals filed for this visit.   Subjective Assessment - 12/25/20 0806     Subjective Pt reports she has vaginal dialators and brought to clinic with her and also recieved Good Clean Love lubricant and wash as well and reports good results with this. Pt reports use of size 1 dialator at home without moving dialator however does try small movements with pelvis. Pt reports no more than 0/10 pain with size 1 in place.    Patient Stated Goals to have less pain    Currently in Pain? No/denies                            Pelvic Floor Special Questions - 12/25/20 0001     Pelvic Floor Internal Exam pt identified, patient confirms consent for PT to perform inernal soft tissue work and muscle strength and integrity assessment    Exam Type Vaginal    Palpation pt able to self insert size 1 dialator from home and place in comfortable position reporting 0/10. Pt then agreed to allow PT to assist in mobility of dialator in Ant/Post, Rt/Lt quadrants wiht 3/10 pain per pt with light pressure and small movement.  Pt then reported good ability to self mobilize with 0-1/10 pain in same quadrants. Pt reported feeling more tightness on Rt side compared to ant/post/Lt    Strength --   unable to assess strength as pt unable to tolerate and deferred other than use of dialator at this time              [facility] Adult PT Treatment/Exercise - 12/25/20 0001       Self-Care   Self-Care Other Self-Care Comments    Other Self-Care Comments  Pt educated and discussed with PT use of vaginal dialators, safe progression with no more than 3-4/10 pain with current size, tolerating movement with dialator, possibility of progression with husband to assist or insterting her own finger at attempt as pt is comfortable and to continue to use lubricant with this. Pt understood and agreed. PT also educated pt to only progress with any of the options as she feels comfortable and not with increased pain levels.                    PT Education - 12/25/20 0941     Education Details Access Code [email or web address]. Pt educated on use of her own purchased vaginal dialators, safe progressions, and lubricant use,  use of relaxation techniques.    Person(s) Educated Patient    Methods Explanation;Demonstration;Tactile cues;Verbal cues    Comprehension Verbalized understanding;Returned demonstration              PT Short Term Goals - 12/11/20 0858       PT SHORT TERM GOAL #1   Title pt to be I with HEP    Time 4    Period Weeks    Status Achieved    Target Date 12/18/20      PT SHORT TERM GOAL #2   Title pt to demonstrate improved ROM at Bil hamstrings and external rotation to no more than 15% limitations    Baseline 25% limitations    Time 4    Period Weeks    Status On-going    Target Date 12/18/20      PT SHORT TERM GOAL #3   Title pt to report improved tolerance to no more than 3/10 pain to level one dialator with penetration vaginally for improved tolerance to vaginal exam    Time 6    Period Weeks    Status  On-going    Target Date 01/01/21               PT Long Term Goals - 11/20/20 0911       PT LONG TERM GOAL #1   Title pt to be I with advanced HEP    Time 8    Period Weeks    Status New    Target Date 01/15/21      PT LONG TERM GOAL #2   Title pt to report no more than 4/10 pain with level 3 dialator with vaginal penetration    Time 8    Period Weeks    Status New    Target Date 01/15/21      PT LONG TERM GOAL #3   Title pt to demonstrate no more than 10% ROM limtations at Bil hip external rotation and hamstrings    Time 8    Period Weeks    Status New    Target Date 01/15/21                   Plan - 12/25/20 0942     Clinical Impression Statement Pt presenting to clinic with her personal vaginal dilators to discuss and use for session. Pt reports greatly improved tolerance to size one dilator with her own use and wants her goal to increase size and also for her husband to be able to assist her with use without her pain levels increasing. Pt session focused on education of dilators, progressions, relaxation and internal treatment with size one dilator with pt self inserting and tolerating this without pain. Pt agreed for PT to attempt mobility of dilator with 3/10 pain noted but pt agreed to attempt, and then 0/10 pain when she completed mobility. Pt educated on attempting in different positions at home based on her level of comfort and pain. Pt agreed. Pt also educated on relaxation techniques with progression. Pt would benefit from continued PT for improved tolerance to vaginal penetration, decreased pain, improved mobility and relaxation.    Personal Factors and Comorbidities Time since onset of injury/illness/exacerbation;Sex    Examination-Activity Limitations Other    Examination-Participation Restrictions Interpersonal Relationship    Stability/Clinical Decision Making Stable/Uncomplicated    Clinical Decision Making Low    Rehab Potential Good    PT  Frequency 1x / week    PT Duration  8 weeks    PT Treatment/Interventions ADLs/Self Care Home Management;Functional mobility training;Therapeutic activities;Neuromuscular re-education;Manual techniques;Patient/family education;Passive range of motion;Dry needling;Energy conservation    PT Next Visit Plan relaxation with mobility, breathing mechanics, hips/glutes manual, internal?    PT Home Exercise Plan Access Code [email or web address]    Consulted and Agree with Plan of Care Patient             Patient will benefit from skilled therapeutic intervention in order to improve the following deficits and impairments:  Decreased coordination, Decreased endurance, Decreased activity tolerance, Decreased mobility, Decreased strength, Postural dysfunction, Improper body mechanics, Impaired flexibility, Decreased range of motion, Impaired tone  Visit Diagnosis: Unspecified lack of coordination  Cramp and spasm  Other abnormalities of gait and mobility     Problem List Patient Active Problem List   Diagnosis Date Noted   Dyspareunia, female 10/09/2020   Vaginismus 10/09/2020   Otelia Sergeant, PT 12/25/2208:59 AM   [facility] [address] [place], [place], [address] Phone: [phone]   Fax:  [phone]  Name: Barbara Whitehead MRN: [id number] Date of Birth: 1991/05/30

## 2020-12-26 ENCOUNTER — Encounter (HOSPITAL_BASED_OUTPATIENT_CLINIC_OR_DEPARTMENT_OTHER): Payer: Self-pay

## 2020-12-27 DIAGNOSIS — F4323 Adjustment disorder with mixed anxiety and depressed mood: Secondary | ICD-10-CM | POA: Diagnosis not present

## 2021-01-01 ENCOUNTER — Ambulatory Visit: Payer: BC Managed Care – PPO | Admitting: Physical Therapy

## 2021-01-01 DIAGNOSIS — F411 Generalized anxiety disorder: Secondary | ICD-10-CM | POA: Diagnosis not present

## 2021-01-01 DIAGNOSIS — F419 Anxiety disorder, unspecified: Secondary | ICD-10-CM | POA: Diagnosis not present

## 2021-01-08 ENCOUNTER — Encounter: Payer: BC Managed Care – PPO | Admitting: Physical Therapy

## 2021-01-17 DIAGNOSIS — F419 Anxiety disorder, unspecified: Secondary | ICD-10-CM | POA: Diagnosis not present

## 2021-01-17 DIAGNOSIS — F4323 Adjustment disorder with mixed anxiety and depressed mood: Secondary | ICD-10-CM | POA: Diagnosis not present

## 2021-01-22 ENCOUNTER — Ambulatory Visit: Payer: BC Managed Care – PPO | Attending: Obstetrics & Gynecology | Admitting: Physical Therapy

## 2021-01-22 ENCOUNTER — Other Ambulatory Visit: Payer: Self-pay

## 2021-01-22 DIAGNOSIS — R279 Unspecified lack of coordination: Secondary | ICD-10-CM | POA: Diagnosis not present

## 2021-01-22 DIAGNOSIS — R252 Cramp and spasm: Secondary | ICD-10-CM | POA: Diagnosis not present

## 2021-01-22 NOTE — Therapy (Signed)
[facility] [address] [place], [place], [address] Phone: [phone]   Fax:  [phone]  Physical Therapy Treatment  Patient Details  Name: Barbara Whitehead MRN: [id number] Date of Birth: 1990-09-19 Referring Provider (PT): Jerene Bears, MD   Encounter Date: 01/22/2021   PT End of Session - 01/22/21 0853     Visit Number 5    Authorization Type BCBS    Authorization - Visit Number 5    Authorization - Number of Visits 60    PT Start Time 0801    PT Stop Time 0845    PT Time Calculation (min) 44 min    Activity Tolerance Patient tolerated treatment well;No increased pain    Behavior During Therapy [facility] for tasks assessed/performed             No past medical history on file.  Past Surgical History:  Procedure Laterality Date   WISDOM TOOTH EXTRACTION  2017    There were no vitals filed for this visit.   Subjective Assessment - 01/22/21 0804     Subjective Pt reports is now able to start with vaginal dialator size two and then increase to size three, no increased pain with size two with penetration or mobility and no increased pain with size three penetration but unable to mobilize as much at this point. Pt able to use dialators in various positions as well.    Patient Stated Goals to have less pain    Currently in Pain? No/denies                            Pelvic Floor Special Questions - 01/22/21 0001     Pelvic Floor Internal Exam pt identified, patient confirms consent for PT to perform inernal soft tissue work and muscle strength and integrity assessment    Exam Type Vaginal    Sensation WFL    Palpation pt agreeable to PT inserting size one dialator, no pain per pt. no pain with mobility in all quadrants of dialator. Pt then agreeable and requested to pelvic assessment with PT inserting one gloved finger to assess tolerance of this. Pt able to tolerate mobility for muscle  assessment in all quadrants with mild tightness noted but pt able to relax with extra time and breathing techniques. pt denied pain with this. progressive relaxtation video played during treatment as pt reported this helps her at home. At end of session pt reports she felt good about progress and had multiple questions about progressions as she able, all questions answered and pt denied additional questions or needs.    Strength strong squeeze, against strong resistance    Strength # of reps --    Strength # of seconds --    Tone increased               [facility] Adult PT Treatment/Exercise - 01/22/21 0001       Self-Care   Self-Care Other Self-Care Comments    Other Self-Care Comments  Pt educated on relaxation and breathing techniques to increase pelvic relaxation for increased tolerance and decreased pain with vaginal penetration. Pt also educated on techniques to progress with dialators as well ways to progress in comfort with pt's husband assisting in dialator use.      Neuro Re-ed    Neuro Re-ed Details  diaphragmatic breathing in supine with pelvic drops to assist in pelvic relaxation during internal pelvic treatment with good  effect.                    PT Education - 01/22/21 0845     Education Details Pt educated on techniques to progress with dialators and pelvic relaxation with husband as she is comfortable, pt reporting her goal is to progress with husband to assist in dialator use.    Person(s) Educated Patient    Methods Explanation;Demonstration;Tactile cues;Verbal cues    Comprehension Verbalized understanding;Returned demonstration              PT Short Term Goals - 01/22/21 0900       PT SHORT TERM GOAL #1   Title pt to be I with HEP    Time 4    Period Weeks    Status Achieved    Target Date 12/18/20      PT SHORT TERM GOAL #2   Title pt to demonstrate improved ROM at Bil hamstrings and external rotation to no more than 15% limitations     Baseline 25% limitations    Time 4    Period Weeks    Status Achieved    Target Date 12/18/20      PT SHORT TERM GOAL #3   Title pt to report improved tolerance to no more than 3/10 pain to level one dialator with penetration vaginally for improved tolerance to vaginal exam    Time 6    Period Weeks    Status Achieved    Target Date 01/01/21               PT Long Term Goals - 01/22/21 0901       PT LONG TERM GOAL #1   Title pt to be I with advanced HEP    Time 8    Period Weeks    Status Achieved      PT LONG TERM GOAL #2   Title pt to report no more than 4/10 pain with level 3 dialator with vaginal penetration    Time 8    Period Weeks    Status Achieved      PT LONG TERM GOAL #3   Title pt to demonstrate no more than 10% ROM limtations at Bil hip external rotation and hamstrings    Time 8    Period Weeks    Status Achieved      PT LONG TERM GOAL #4   Title pt to report no more than 2/10 pain with level 6 dialator or equivalent to decrease pain with vaginal penetration    Baseline unable to attempt size 4    Time 12    Period Weeks    Status New    Target Date 04/16/21      PT LONG TERM GOAL #5   Title pt to demonstrate ability to tolerate full internal exam to best assess pelvic floor contract/relax ability with proper coodination    Time 12    Period Weeks    Status New    Target Date 04/16/21      Additional Long Term Goals   Additional Long Term Goals Yes      PT LONG TERM GOAL #6   Title pt to tolerate mobility of level 6 dialator or equivalent in all quadrants without pain for improved ability to have intercourse    Time 12    Period Weeks    Status New    Target Date 04/16/21  Plan - 01/22/21 0853     Clinical Impression Statement Pt presents to clinic reporting she has been progressing with vaginal dialators at home and thinks this is helping her pelvic pain levels and decreasing pain with penetration with  dialator use. Pt is able to use tampons when needed and reports less pain with this as well. Pt reports her husband is very supportive of PT and she would like to increase progressions to have husband assist in dialator use at home as she feels she gets increased tightness and pain with penetration when someone else attempts to insert dialator. Pt states her goal is to be more intimate with spouse and eventually have painfree intercourse. Pt requested therapist to attempt size one dialator and see her response to this before trying this with her husband, this was completed and pt reported no pain with size one dialator with penetration or mobility in all quadrants. Pt then requested to have therapist attempt exam this date as she feels she is able to tolerate this now. PT completed this with mildly increased tone at vaginal opening though pt denied pain, relaxation video pt uses at home used during session to increase relaxation of pt with good effect, and mild increased tone noted with mobility in Rt and anterior quadrants however pt denied pain "some discomfort" but denied need to halt exam. Pt then demonstrated good ability to relax pelvis and denied pain for rest of exam. Focus of this exam was to assess tone and pt's tolerance to PT's exam as pt unable to tolerate this previously. Pt educated on progress she has made and had questions on how to progress further at home with dialators and ways to progress with husband to assist without pain. All questions answered and pt very pleased with progress at this time. t would benefit from continued PT for improved tolerance to vaginal penetration, decreased pain, improved mobility and relaxation.    Personal Factors and Comorbidities Time since onset of injury/illness/exacerbation;Sex    Examination-Participation Restrictions Interpersonal Relationship    Stability/Clinical Decision Making Stable/Uncomplicated    Clinical Decision Making Low    Rehab Potential  Good    PT Frequency Biweekly    PT Duration 12 weeks    PT Treatment/Interventions ADLs/Self Care Home Management;Functional mobility training;Therapeutic activities;Neuromuscular re-education;Manual techniques;Patient/family education;Passive range of motion;Dry needling;Energy conservation    PT Next Visit Plan relaxation with mobility, hips/glutes manual    PT Home Exercise Plan Access Code [email or web address]    Consulted and Agree with Plan of Care Patient             Patient will benefit from skilled therapeutic intervention in order to improve the following deficits and impairments:  Decreased coordination, Decreased endurance, Decreased activity tolerance, Decreased mobility, Decreased strength, Postural dysfunction, Improper body mechanics, Impaired flexibility, Decreased range of motion, Impaired tone  Visit Diagnosis: Lack of coordination - Plan: PT plan of care cert/re-cert  Cramp and spasm - Plan: PT plan of care cert/re-cert     Problem List Patient Active Problem List   Diagnosis Date Noted   Dyspareunia, female 10/09/2020   Vaginismus 10/09/2020   Otelia Sergeant, PT 08/23/229:08 AM    [facility] [address], [address] [place], [place], [address] Phone: [phone]   Fax:  [phone]  Name: Danayah Smyre MRN: [id number] Date of Birth: 09/08/1990

## 2021-01-26 ENCOUNTER — Encounter: Payer: Self-pay | Admitting: Physical Therapy

## 2021-01-29 DIAGNOSIS — F419 Anxiety disorder, unspecified: Secondary | ICD-10-CM | POA: Diagnosis not present

## 2021-01-30 DIAGNOSIS — F4323 Adjustment disorder with mixed anxiety and depressed mood: Secondary | ICD-10-CM | POA: Diagnosis not present

## 2021-02-05 ENCOUNTER — Encounter: Payer: Self-pay | Admitting: Physical Therapy

## 2021-02-05 ENCOUNTER — Other Ambulatory Visit: Payer: Self-pay

## 2021-02-05 ENCOUNTER — Ambulatory Visit: Payer: BC Managed Care – PPO | Attending: Obstetrics & Gynecology | Admitting: Physical Therapy

## 2021-02-05 DIAGNOSIS — R2689 Other abnormalities of gait and mobility: Secondary | ICD-10-CM | POA: Insufficient documentation

## 2021-02-05 DIAGNOSIS — R252 Cramp and spasm: Secondary | ICD-10-CM | POA: Insufficient documentation

## 2021-02-05 DIAGNOSIS — R279 Unspecified lack of coordination: Secondary | ICD-10-CM | POA: Insufficient documentation

## 2021-02-05 DIAGNOSIS — M6281 Muscle weakness (generalized): Secondary | ICD-10-CM | POA: Diagnosis not present

## 2021-02-05 DIAGNOSIS — R293 Abnormal posture: Secondary | ICD-10-CM | POA: Diagnosis not present

## 2021-02-05 NOTE — Therapy (Signed)
[facility] [address] [place], [place], [address] Phone: [phone]   Fax:  [phone]  Physical Therapy Treatment  Patient Details  Name: Barbara Whitehead MRN: [id number] Date of Birth: [birth year] Referring Provider (PT): Jerene Bears, MD   Encounter Date: 02/05/2021   PT End of Session - 02/05/21 1056     Visit Number 6    Authorization Type BCBS    Authorization - Visit Number 6    Authorization - Number of Visits 60    PT Start Time 0800    PT Stop Time 0845    PT Time Calculation (min) 45 min    Activity Tolerance Patient tolerated treatment well;No increased pain    Behavior During Therapy [facility] for tasks assessed/performed             History reviewed. No pertinent past medical history.  Past Surgical History:  Procedure Laterality Date   WISDOM TOOTH EXTRACTION  2017    There were no vitals filed for this visit.   Subjective Assessment - 02/05/21 0804     Subjective Pt requests a print out of anatomy of pelvic floor to help educate husband to better participate in home use of dilator with husband. Pt reports she is comfortable with size three dilator and has not been able to progress to size 4 yet however is motivated to complete. Pt reports being more comfortable with husband assistinging home ues as well with size 1.    Patient Stated Goals to have less pain    Currently in Pain? No/denies    Multiple Pain Sites No                            Pelvic Floor Special Questions - 02/05/21 0001     Pelvic Floor Internal Exam pt identified, patient confirms consent for PT to perform inernal soft tissue work and muscle strength and integrity assessment    Exam Type Vaginal    Sensation WFL    Palpation pt requesting PT to attepmt placement of size 2 to improve confidence and comfortability with her husband progressing in size 2>3>4 as well at home. pt agreeable to PT inserting  size two>three dialator, no pain per pt. no pain with mobility in all quadrants of dilator. Pt then requested to attempt size 4 with PT to place and monitor pain, pt tolerated full insertion of size 4 however reported 4/10 pain without movement once inserted and no mobility of dilator attempted due to pt's reported pain, dilator removed and pt reported no pain with this. Pt reports this is same as progression into size 3 dilator and motivated to continue progression to 4 more consistently.    Tone increased               [facility] Adult PT Treatment/Exercise - 02/05/21 0820       Self-Care   Self-Care Other Self-Care Comments    Other Self-Care Comments  Pt educated on                    PT Education - 02/05/21 1051     Education Details Pt educated on techniques with continued progression with dilators and pelvic relaxation with her individual progression and with husband to assist as comfortable also. Pt also given handout for intercourse positions to potentially decrease pain with vaginal penetration and increase pt comfort. Pt also given handout for female anatomy at  pt request to better understand pelvic floor muscle.    Person(s) Educated Patient    Methods Explanation;Demonstration;Tactile cues;Verbal cues;Handout    Comprehension Verbalized understanding;Returned demonstration              PT Short Term Goals - 02/05/21 1057       PT SHORT TERM GOAL #1   Title pt to be I with HEP    Time 4    Period Weeks    Status Achieved    Target Date 12/18/20      PT SHORT TERM GOAL #2   Title pt to demonstrate improved ROM at Bil hamstrings and external rotation to no more than 15% limitations    Baseline 25% limitations    Time 4    Period Weeks    Status Achieved    Target Date 12/18/20      PT SHORT TERM GOAL #3   Title pt to report improved tolerance to no more than 3/10 pain to level one dialator with penetration vaginally for improved tolerance to vaginal  exam    Time 6    Period Weeks    Status Achieved    Target Date 01/01/21               PT Long Term Goals - 02/05/21 1058       PT LONG TERM GOAL #1   Title pt to be I with advanced HEP    Time 8    Period Weeks    Status Achieved      PT LONG TERM GOAL #2   Title pt to report no more than 4/10 pain with level 3 dialator with vaginal penetration    Time 8    Period Weeks    Status Achieved      PT LONG TERM GOAL #3   Title pt to demonstrate no more than 10% ROM limtations at Bil hip external rotation and hamstrings    Time 8    Period Weeks    Status Achieved      PT LONG TERM GOAL #4   Title pt to report no more than 2/10 pain with level 6 dialator or equivalent to decrease pain with vaginal penetration    Baseline unable to attempt size 4    Time 12    Period Weeks    Status On-going    Target Date 04/16/21      PT LONG TERM GOAL #5   Title pt to demonstrate ability to tolerate full internal exam to best assess pelvic floor contract/relax ability with proper coodination    Time 12    Period Weeks    Status Achieved      PT LONG TERM GOAL #6   Title pt to tolerate mobility of level 6 dialator or equivalent in all quadrants without pain for improved ability to have intercourse    Time 12    Period Weeks    Status On-going    Target Date 04/16/21                   Plan - 02/05/21 1056     Clinical Impression Statement Pt presents to clinic reporting she continues to progress at home with vaginal dilators and now using size 3 herself and husband is now able to place size one. pt educated on how to progress this at home with herself and her husband pt agreed. Pt agreeable to vaginal treatment session and requested to attempt to progress  with PT placement of dilator as pt does continue to demonstrate increased tightness with additional person placing dilator and pt's goal is to be more relaxed and comfortable with husband being able to place to then  progress toward pain free intercourse with husband and to tolerate pelvic exam with pap smear from MD. Pt progressing toward this however continues to demonstrate difficulty with size 4 vaginal dilator and unable to attempt intercourse but progressing each session. Pt tolerated size two and thress without pain with PT placing size two initially no pain with penetration, mobility or removal and same with size three. Pt requested to attempt size 4, 4/10 pain reported with insertion no mobility attempted and no pain with removal per pt. Pt educated on all findings and continued progression and progression since start of session. Pt very pleased with improvement so far but continues to be unable to have intercourse or have a pelvic exam with MD for pap smear which is her goal and therefore would benefit from continued PT to address this. Pt given handout for female anatomy for improved understanding for her and husband for pelvic floor at pt request and handout for intercourse positions for attempting to decrease pain with intercouse when able to attempt and pt wanted this info to know beforehand to decrease anxiety and pelvic tension. Pt also benefits from relaxation video during session to improve relaxation at pelvis and throughout and VC for diaphagramatic breathing to improve relaxation as well.    Personal Factors and Comorbidities Time since onset of injury/illness/exacerbation;Sex    Examination-Activity Limitations Other   vaginal penetration   Examination-Participation Restrictions Interpersonal Relationship    Stability/Clinical Decision Making Stable/Uncomplicated    Clinical Decision Making Low    Rehab Potential Good    PT Frequency Biweekly    PT Duration 12 weeks    PT Treatment/Interventions ADLs/Self Care Home Management;Functional mobility training;Therapeutic activities;Neuromuscular re-education;Manual techniques;Patient/family education;Passive range of motion;Dry needling;Energy  conservation    PT Next Visit Plan relaxation with mobility, hips/glutes manual    PT Home Exercise Plan Access Code [email or web address]    Consulted and Agree with Plan of Care Patient             Patient will benefit from skilled therapeutic intervention in order to improve the following deficits and impairments:  Decreased coordination, Decreased endurance, Decreased activity tolerance, Decreased mobility, Decreased strength, Postural dysfunction, Improper body mechanics, Impaired flexibility, Decreased range of motion, Impaired tone, Pain, Increased muscle spasms  Visit Diagnosis: Lack of coordination  Abnormal posture  Other abnormalities of gait and mobility     Problem List Patient Active Problem List   Diagnosis Date Noted   Dyspareunia, female 10/09/2020   Vaginismus 10/09/2020   Otelia Sergeant, PT, DPT 02/05/2210:36 AM   [facility] [address] [place], [place], [address] Phone: [phone]   Fax:  [phone]  Name: Barbara Whitehead MRN: [id number] Date of Birth: [birth year]

## 2021-02-06 DIAGNOSIS — F419 Anxiety disorder, unspecified: Secondary | ICD-10-CM | POA: Diagnosis not present

## 2021-02-13 DIAGNOSIS — F4323 Adjustment disorder with mixed anxiety and depressed mood: Secondary | ICD-10-CM | POA: Diagnosis not present

## 2021-02-19 ENCOUNTER — Encounter: Payer: Self-pay | Admitting: Physical Therapy

## 2021-02-19 ENCOUNTER — Other Ambulatory Visit: Payer: Self-pay

## 2021-02-19 ENCOUNTER — Ambulatory Visit: Payer: BC Managed Care – PPO | Admitting: Physical Therapy

## 2021-02-19 DIAGNOSIS — M6281 Muscle weakness (generalized): Secondary | ICD-10-CM | POA: Diagnosis not present

## 2021-02-19 DIAGNOSIS — R293 Abnormal posture: Secondary | ICD-10-CM | POA: Diagnosis not present

## 2021-02-19 DIAGNOSIS — R252 Cramp and spasm: Secondary | ICD-10-CM

## 2021-02-19 DIAGNOSIS — R2689 Other abnormalities of gait and mobility: Secondary | ICD-10-CM | POA: Diagnosis not present

## 2021-02-19 DIAGNOSIS — R279 Unspecified lack of coordination: Secondary | ICD-10-CM | POA: Diagnosis not present

## 2021-02-19 NOTE — Patient Instructions (Signed)
Pelvic Floor Vaginal dilators                                                             Amielle Restore Vaginal Dilator Kit                                                        Vulva Tech                                                                              Restore                                                                                                                   Soul Source                                                                                    Intimate Rose                                                                                        Inspire Silicone Dilator Set                                                                                 V Well  dilator set                                                                                             Milli Dilator that you pump                                                                               Berman dilator                                                                                             [place] dilators                                                                  Vaginismus Vaginal dilators                                                    Oh Nut for deep vaginal penetration limitation  [email or web address]  Most of these dilators you can get on [employer]. The ones you are not able to do then look at the company website or [email or web address]     [email or web address] per pt request

## 2021-02-19 NOTE — Therapy (Addendum)
[facility] [address] [place], [place], [address] Phone: [phone]   Fax:  [phone]  Physical Therapy Treatment  Patient Details  Name: Barbara Whitehead MRN: [id number] Date of Birth: March 16, 1991 Referring Provider (PT): Jerene Bears, MD   Encounter Date: 02/19/2021   PT End of Session - 02/19/21 0759     Visit Number 7    Authorization Type BCBS    Authorization - Visit Number 7    Authorization - Number of Visits 60    PT Start Time 0800    PT Stop Time 0844    PT Time Calculation (min) 44 min    Activity Tolerance Patient tolerated treatment well;No increased pain    Behavior During Therapy [facility] for tasks assessed/performed             History reviewed. No pertinent past medical history.  Past Surgical History:  Procedure Laterality Date   WISDOM TOOTH EXTRACTION  2017    There were no vitals filed for this visit.   Subjective Assessment - 02/19/21 0759     Subjective pt reports she is now using size 4 vaginal dilator with 1/10 pain. Pt has been using size 1 dilator with husband to improve her comfort with intimacy with spouse without pelvic pain but unable to progress to size 2 at this time.    Patient Stated Goals to have less pain with vaginal penetration    Currently in Pain? No/denies                               [facility] Adult PT Treatment/Exercise - 02/19/21 0001       Self-Care   Self-Care Other Self-Care Comments    Other Self-Care Comments  Pt had multiple questions this session about progression with dilators vaginally and techniques to attempt increasing intimacy with her husband as she feels he gets anxious with use of dilators and she is unable to progress from size one with him. Pt also had questions about products or devices that may assist in herself progressing as well. Pt educated on progression of additional sizes with second set of vaginal dilators, use  of first set of dilators in different positions that would still be less than 3-4/10 pain and pt also educated on Oh[employer]ut device for decreased pain with vaginal penetration as she had questions about progressing with husband as well. Pt also educated on sexual counseling to also participate in pt's care  to address futher pt's anxiety with intimacy and per pt her sex drive. Pt very open to this and discussed options with virtual care vs in person and local options. Pt reported understanding and thankful for this session's information as she reported being anxious about what her options were moving forward. Pt also educated that we can adjust PT schedule if needed to allow pt to schedule other options as needed and she reported this helped decrease her anxiety as well.                     PT Education - 02/19/21 0859     Education Details Pt educated on progressions with vaginal dilators at home, potentially seeking care with sexual therapist as well to progress with intimacy as this was pt's goal and she had questions about this during session. PT suggested this service and pt very agreeable and motivated to attempt this as well.    Person(s)  Educated Patient    Methods Explanation;Demonstration;Tactile cues;Verbal cues    Comprehension Verbalized understanding;Returned demonstration              PT Short Term Goals - 02/05/21 1057       PT SHORT TERM GOAL #1   Title pt to be I with HEP    Time 4    Period Weeks    Status Achieved    Target Date 12/18/20      PT SHORT TERM GOAL #2   Title pt to demonstrate improved ROM at Bil hamstrings and external rotation to no more than 15% limitations    Baseline 25% limitations    Time 4    Period Weeks    Status Achieved    Target Date 12/18/20      PT SHORT TERM GOAL #3   Title pt to report improved tolerance to no more than 3/10 pain to level one dialator with penetration vaginally for improved tolerance to vaginal exam    Time  6    Period Weeks    Status Achieved    Target Date 01/01/21               PT Long Term Goals - 02/05/21 1058       PT LONG TERM GOAL #1   Title pt to be I with advanced HEP    Time 8    Period Weeks    Status Achieved      PT LONG TERM GOAL #2   Title pt to report no more than 4/10 pain with level 3 dialator with vaginal penetration    Time 8    Period Weeks    Status Achieved      PT LONG TERM GOAL #3   Title pt to demonstrate no more than 10% ROM limtations at Bil hip external rotation and hamstrings    Time 8    Period Weeks    Status Achieved      PT LONG TERM GOAL #4   Title pt to report no more than 2/10 pain with level 6 dialator or equivalent to decrease pain with vaginal penetration    Baseline unable to attempt size 4    Time 12    Period Weeks    Status On-going    Target Date 04/16/21      PT LONG TERM GOAL #5   Title pt to demonstrate ability to tolerate full internal exam to best assess pelvic floor contract/relax ability with proper coodination    Time 12    Period Weeks    Status Achieved      PT LONG TERM GOAL #6   Title pt to tolerate mobility of level 6 dialator or equivalent in all quadrants without pain for improved ability to have intercourse    Time 12    Period Weeks    Status On-going    Target Date 04/16/21                   Plan - 02/19/21 1105     Clinical Impression Statement Pt presents to clinic reporting she is now usin vaginal dilator size 4 with 1/10 pain with mobility after more than 10 mins, no pain with penetration or initial mobility but statically in place can increase to 1/10. Pt educated on removal with pain as needed and she reports she understands. Pt had mulitple questions this session about progressions moving forward, pt educated on various options including increasing tolerance  to increased sizing of dilators with next set or dilators in various positions, or progression with husband's assistance with  dilators however pt reports husband seemed to have increased anxiety with dilator use and didn't want to hurt pt and has been unable to increase to size two with husband's assistance, pt also educated that per her comfort level and as her goal is to progress toward intercouse pt can also progress with intimacy as well whether external or internal to improve pelvic relaxation and decrease pelvic pain as well. Pt expressed motivation to trying this and communication of this with spouse however also described what seemed to be anxiety with sexual drive being lower per pt, anxiety with intimacy and educated on sexual counseling and ways to access this virtually and locally. Pt reports she feels this would be helpful to further address barriers in this aspect of care. Pt very grateful for this infor and reports that she feels open to all recommendations. Pt also had questions regarding how to communicate with future healthcare providers of her history of pelvic pain to be able to tolerate a pelvic exam in the future and this was discussed as well. Pt denied additional questions at end of session. pt also educated on continued pelvic relaxation techniques throughout each day to decrease tension in this area.   Pt also benefits from relaxation video during session to improve relaxation at pelvis and throughout and VC for diaphagramatic breathing to improve relaxation as well.    Personal Factors and Comorbidities Time since onset of injury/illness/exacerbation;Sex    Examination-Activity Limitations Other    Examination-Participation Restrictions Interpersonal Relationship    Stability/Clinical Decision Making Stable/Uncomplicated    Rehab Potential Good    PT Frequency Biweekly    PT Duration 12 weeks    PT Treatment/Interventions ADLs/Self Care Home Management;Functional mobility training;Therapeutic activities;Neuromuscular re-education;Manual techniques;Patient/family education;Passive range of motion;Dry  needling;Energy conservation    PT Next Visit Plan relaxation with mobility, hips/glutes manual    PT Home Exercise Plan Access Code [email or web address]    Consulted and Agree with Plan of Care Patient             Patient will benefit from skilled therapeutic intervention in order to improve the following deficits and impairments:  Decreased coordination, Decreased endurance, Decreased activity tolerance, Decreased mobility, Decreased strength, Postural dysfunction, Improper body mechanics, Impaired flexibility, Decreased range of motion, Impaired tone, Pain, Increased muscle spasms  Visit Diagnosis: Cramp and spasm  Lack of coordination  Muscle weakness (generalized)     Problem List Patient Active Problem List   Diagnosis Date Noted   Dyspareunia, female 10/09/2020   Vaginismus 10/09/2020    Otelia Sergeant, PT, DPT 02/19/2210:16 AM   [facility] [address] [place], [place], [address] Phone: [phone]   Fax:  [phone]  Name: Barbara Whitehead MRN: [id number] Date of Birth: 1990/08/07

## 2021-02-26 DIAGNOSIS — F4323 Adjustment disorder with mixed anxiety and depressed mood: Secondary | ICD-10-CM | POA: Diagnosis not present

## 2021-03-01 DIAGNOSIS — F4320 Adjustment disorder, unspecified: Secondary | ICD-10-CM | POA: Diagnosis not present

## 2021-03-04 ENCOUNTER — Encounter: Payer: Self-pay | Admitting: Obstetrics and Gynecology

## 2021-03-04 ENCOUNTER — Ambulatory Visit: Payer: BC Managed Care – PPO | Admitting: Obstetrics and Gynecology

## 2021-03-04 ENCOUNTER — Other Ambulatory Visit (HOSPITAL_COMMUNITY)
Admission: RE | Admit: 2021-03-04 | Discharge: 2021-03-04 | Disposition: A | Discharge status: Home / Self Care | Payer: BC Managed Care – PPO | Source: Ambulatory Visit | Attending: Obstetrics and Gynecology | Admitting: Obstetrics and Gynecology

## 2021-03-04 ENCOUNTER — Other Ambulatory Visit: Payer: Self-pay

## 2021-03-04 VITALS — BP 150/99 | HR 103 | Ht 64.0 in | Wt 161.0 lb

## 2021-03-04 DIAGNOSIS — N941 Unspecified dyspareunia: Secondary | ICD-10-CM

## 2021-03-04 DIAGNOSIS — Z124 Encounter for screening for malignant neoplasm of cervix: Secondary | ICD-10-CM | POA: Insufficient documentation

## 2021-03-04 DIAGNOSIS — N841 Polyp of cervix uteri: Secondary | ICD-10-CM | POA: Diagnosis not present

## 2021-03-04 DIAGNOSIS — N942 Vaginismus: Secondary | ICD-10-CM

## 2021-03-04 NOTE — Progress Notes (Signed)
   GYNECOLOGY OFFICE NOTE  History:  30 y.o. G0P0000 here today for pap smear. Patient with difficulty with pelvic exam 09/2020 due to pain/discomfort and returns for pap today. Has been going to PT and reports significant improvement. Able to routinely use vaginal dilator now.    History reviewed. No pertinent past medical history.  Past Surgical History:  Procedure Laterality Date   WISDOM TOOTH EXTRACTION  2017    No current outpatient medications on file.  The following portions of the patient's history were reviewed and updated as appropriate: allergies, current medications, past family history, past medical history, past social history, past surgical history and problem list.   Review of Systems:  Pertinent items noted in HPI and remainder of comprehensive ROS otherwise negative.   Objective:  Physical Exam BP (!) 150/99   Pulse (!) 103   Ht 5\' 4"  (1.626 m)   Wt 161 lb (73 kg)   LMP 02/05/2021   BMI 27.64 kg/m  CONSTITUTIONAL: Well-developed, well-nourished female in no acute distress.  HENT:  Normocephalic, atraumatic. External right and left ear normal. Oropharynx is clear and moist EYES: Conjunctivae and EOM are normal. Pupils are equal, round, and reactive to light. No scleral icterus.  NECK: Normal range of motion, supple, no masses SKIN: Skin is warm and dry. No rash noted. Not diaphoretic. No erythema. No pallor. NEUROLOGIC: Alert and oriented to person, place, and time. Normal reflexes, muscle tone coordination. No cranial nerve deficit noted. PSYCHIATRIC: Normal mood and affect. Normal behavior. Normal judgment and thought content. CARDIOVASCULAR: Normal heart rate noted RESPIRATORY: Effort normal, no problems with respiration noted ABDOMEN: Soft, no distention noted.   PELVIC: Normal appearing external genitalia; normal appearing vaginal mucosa and cervix. 1 cm polyp noted at cervical os, stalk not visible. No abnormal discharge noted.  Pap smear  obtained. MUSCULOSKELETAL: Normal range of motion. No edema noted.  Exam done with chaperone present.  Labs and Imaging No results found.  Assessment & Plan:   1. Screening for cervical cancer - Cytology - PAP( [facility])  2. Cervical polyp Will need biopsy, possible polypectomy - 04/07/2021 PELVIC COMPLETE WITH TRANSVAGINAL; Future  3. Dyspareunia, female  4. Vaginismus Improved greatly with PT Cont PT  Routine preventative health maintenance measures emphasized. Please refer to After Visit Summary for other counseling recommendations.   Return in about 4 weeks (around 04/01/2021).  04/03/2021, MD, [facility] Attending Center for [facility])

## 2021-03-05 ENCOUNTER — Other Ambulatory Visit: Payer: Self-pay

## 2021-03-05 ENCOUNTER — Ambulatory Visit: Payer: BC Managed Care – PPO | Attending: Obstetrics & Gynecology | Admitting: Physical Therapy

## 2021-03-05 DIAGNOSIS — R279 Unspecified lack of coordination: Secondary | ICD-10-CM | POA: Insufficient documentation

## 2021-03-05 DIAGNOSIS — M6281 Muscle weakness (generalized): Secondary | ICD-10-CM | POA: Insufficient documentation

## 2021-03-05 DIAGNOSIS — R252 Cramp and spasm: Secondary | ICD-10-CM | POA: Diagnosis not present

## 2021-03-05 NOTE — Therapy (Signed)
[facility] [address] [place], [place], [address] Phone:     Fax:     Physical Therapy Treatment  Patient Details  Name: Barbara Whitehead MRN: [id number] Date of Birth: 03-09-1991 Referring Provider (PT): Jerene Bears, MD   Encounter Date: 03/05/2021   PT End of Session - 03/05/21 0949     Visit Number 8    Authorization Type BCBS    Authorization - Visit Number 8    Authorization - Number of Visits 60    PT Start Time 0810    PT Stop Time 0855    PT Time Calculation (min) 45 min    Activity Tolerance Patient tolerated treatment well;No increased pain    Behavior During Therapy [facility] for tasks assessed/performed             No past medical history on file.  Past Surgical History:  Procedure Laterality Date   WISDOM TOOTH EXTRACTION  2017    There were no vitals filed for this visit.   Subjective Assessment - 03/05/21 0935     Subjective Pt reports she is now using updated set of dilators for home and is on size 5 without pain with penetration or mobility, has increased with husband participation with stretching and intimacy as well without pain and good ability to relax. Pt reports she had a pelvic exam with GYNO and tolerated well for first time able to have one, started with sexual therapist and has had first visit. Pt reports GYNO did find a polyp at cervix during exam and is following up with this medically  but cleared to continue PT at this time.    Patient Stated Goals to have less pain with vaginal penetration    Currently in Pain? No/denies                            Pelvic Floor Special Questions - 03/05/21 0001     Pelvic Floor Internal Exam pt identified, patient confirms consent for PT to perform inernal soft tissue work and muscle strength and integrity assessment    Exam Type Vaginal    Sensation WFL    Tone increased               [facility] Adult PT Treatment/Exercise  - 03/05/21 0001       Self-Care   Self-Care Other Self-Care Comments    Other Self-Care Comments  Pt teducated on menstrual products of disc and cup with demonstratation shown with samples and reports being interested in this now.      Manual Therapy   Manual Therapy Internal Pelvic Floor    Myofascial Release Pt reported mild tenderness with dilator use at home at deeper penetration. Pt agreeable to internal treatment for stretching and muscle relaxation. Pt found to have moderate trigger points at Lt obturator internus, and levator ani with good release with manual work. Pt demonstrated good ability to improve relaxation during session and tolerated well.    Internal Pelvic Floor for manual work.                     PT Education - 03/05/21 0947     Education Details Pt educated on menstrual products for improved comfort and pt wanting to attempt other than tampons. Pt educated on continued progressions with dilators and stretching.    Person(s) Educated Patient    Methods Demonstration;Explanation;Tactile cues;Verbal  cues    Comprehension Verbalized understanding;Returned demonstration              PT Short Term Goals - 02/05/21 1057       PT SHORT TERM GOAL #1   Title pt to be I with HEP    Time 4    Period Weeks    Status Achieved    Target Date 12/18/20      PT SHORT TERM GOAL #2   Title pt to demonstrate improved ROM at Bil hamstrings and external rotation to no more than 15% limitations    Baseline 25% limitations    Time 4    Period Weeks    Status Achieved    Target Date 12/18/20      PT SHORT TERM GOAL #3   Title pt to report improved tolerance to no more than 3/10 pain to level one dialator with penetration vaginally for improved tolerance to vaginal exam    Time 6    Period Weeks    Status Achieved    Target Date 01/01/21               PT Long Term Goals - 02/05/21 1058       PT LONG TERM GOAL #1   Title pt to be I with advanced  HEP    Time 8    Period Weeks    Status Achieved      PT LONG TERM GOAL #2   Title pt to report no more than 4/10 pain with level 3 dialator with vaginal penetration    Time 8    Period Weeks    Status Achieved      PT LONG TERM GOAL #3   Title pt to demonstrate no more than 10% ROM limtations at Bil hip external rotation and hamstrings    Time 8    Period Weeks    Status Achieved      PT LONG TERM GOAL #4   Title pt to report no more than 2/10 pain with level 6 dialator or equivalent to decrease pain with vaginal penetration    Baseline unable to attempt size 4    Time 12    Period Weeks    Status On-going    Target Date 04/16/21      PT LONG TERM GOAL #5   Title pt to demonstrate ability to tolerate full internal exam to best assess pelvic floor contract/relax ability with proper coodination    Time 12    Period Weeks    Status Achieved      PT LONG TERM GOAL #6   Title pt to tolerate mobility of level 6 dialator or equivalent in all quadrants without pain for improved ability to have intercourse    Time 12    Period Weeks    Status On-going    Target Date 04/16/21                   Plan - 03/05/21 0949     Clinical Impression Statement Pt presents to clinic reporting a lot of improvements since last visit with now using size 5 dilator (purchased additional larger set), progressing with husband in stretching internally, pt self stretching as well which is a new progression, and was able to tolerate pelvic exam wit gyno as well which she has not been able to prior to PT, pt found to have cervical polyp with this exam and is being followed for this, also has started  sessions with sexual therapist as well. Pt session focused on education and manual internal pelvic relaxation with trigger point release completed. Pt tolerated session well. Pt continues to demonstrate need of PT for improved pain with vaginal penetration for intercourse.    Personal Factors and  Comorbidities Time since onset of injury/illness/exacerbation;Sex    Examination-Activity Limitations Other    Examination-Participation Restrictions Interpersonal Relationship    Stability/Clinical Decision Making Stable/Uncomplicated    Clinical Decision Making Low    Rehab Potential Good    PT Frequency Biweekly    PT Treatment/Interventions ADLs/Self Care Home Management;Functional mobility training;Therapeutic activities;Neuromuscular re-education;Manual techniques;Patient/family education;Passive range of motion;Dry needling;Energy conservation    PT Next Visit Plan relaxation with mobility, hips/glutes manual    PT Home Exercise Plan Access Code [email or web address]    Consulted and Agree with Plan of Care Patient             Patient will benefit from skilled therapeutic intervention in order to improve the following deficits and impairments:  Decreased coordination, Decreased endurance, Decreased activity tolerance, Decreased mobility, Decreased strength, Postural dysfunction, Improper body mechanics, Impaired flexibility, Decreased range of motion, Impaired tone, Pain, Increased muscle spasms  Visit Diagnosis: Lack of coordination  Muscle weakness (generalized)  Cramp and spasm     Problem List Patient Active Problem List   Diagnosis Date Noted   Dyspareunia, female 10/09/2020   Vaginismus 10/09/2020    Otelia Sergeant, PT, DPT 03/05/2209:51 AM    [facility] [address] [place], [place], [address] Phone:     Fax:     Name: Aunesty Tyson MRN: [id number] Date of Birth: 08-Feb-1991

## 2021-03-06 LAB — CYTOLOGY - PAP
Comment: NEGATIVE
Diagnosis: NEGATIVE
High risk HPV: NEGATIVE

## 2021-03-13 DIAGNOSIS — F4320 Adjustment disorder, unspecified: Secondary | ICD-10-CM | POA: Diagnosis not present

## 2021-03-14 ENCOUNTER — Encounter: Payer: Self-pay | Admitting: Physical Therapy

## 2021-03-19 DIAGNOSIS — F4323 Adjustment disorder with mixed anxiety and depressed mood: Secondary | ICD-10-CM | POA: Diagnosis not present

## 2021-03-20 DIAGNOSIS — F4320 Adjustment disorder, unspecified: Secondary | ICD-10-CM | POA: Diagnosis not present

## 2021-03-26 ENCOUNTER — Other Ambulatory Visit: Payer: Self-pay

## 2021-03-26 ENCOUNTER — Ambulatory Visit (INDEPENDENT_AMBULATORY_CARE_PROVIDER_SITE_OTHER): Payer: BC Managed Care – PPO

## 2021-03-26 DIAGNOSIS — N841 Polyp of cervix uteri: Secondary | ICD-10-CM

## 2021-03-26 DIAGNOSIS — R102 Pelvic and perineal pain: Secondary | ICD-10-CM | POA: Diagnosis not present

## 2021-03-27 DIAGNOSIS — F4320 Adjustment disorder, unspecified: Secondary | ICD-10-CM | POA: Diagnosis not present

## 2021-04-02 ENCOUNTER — Telehealth: Payer: Self-pay | Admitting: *Deleted

## 2021-04-02 NOTE — Telephone Encounter (Signed)
Pt called the office with questions about the appt she has with Dr Penne Lash on Monday.  She is scheduled for a cervical polyp removal in office.  Per pt she has spoken with Dr Penne Lash and would like an anti-anxiety med sent to [facility] on [address] in [place] to take 51min-1 hr prior to procedure.  She is aware that she will need a driver if she is going to take Xanax prior to the appt.  I went reviewed the polyp removal procedure to pt.  She is encouraged to call if she has any further questions.

## 2021-04-03 ENCOUNTER — Other Ambulatory Visit: Payer: Self-pay | Admitting: Obstetrics & Gynecology

## 2021-04-03 DIAGNOSIS — F4320 Adjustment disorder, unspecified: Secondary | ICD-10-CM | POA: Diagnosis not present

## 2021-04-03 MED ORDER — ALPRAZOLAM 0.5 MG PO TABS: ORAL_TABLET | ORAL | 0 refills | Status: DC

## 2021-04-03 NOTE — Progress Notes (Signed)
Xanax for anxiety prior to polyp removal.

## 2021-04-05 ENCOUNTER — Encounter: Payer: Self-pay | Admitting: Physical Therapy

## 2021-04-05 ENCOUNTER — Ambulatory Visit: Payer: BC Managed Care – PPO | Attending: Obstetrics & Gynecology | Admitting: Physical Therapy

## 2021-04-05 ENCOUNTER — Other Ambulatory Visit: Payer: Self-pay

## 2021-04-05 DIAGNOSIS — M6281 Muscle weakness (generalized): Secondary | ICD-10-CM

## 2021-04-05 DIAGNOSIS — R279 Unspecified lack of coordination: Secondary | ICD-10-CM | POA: Diagnosis not present

## 2021-04-05 DIAGNOSIS — R252 Cramp and spasm: Secondary | ICD-10-CM

## 2021-04-05 NOTE — Therapy (Signed)
[facility] [address] [place], [place], [address] Phone: [phone]   Fax:  [phone]  Physical Therapy Treatment  Patient Details  Name: Barbara Whitehead MRN: [id number] Date of Birth: [birth year] Referring Provider (PT): Jerene Bears, MD   Encounter Date: 04/05/2021   PT End of Session - 04/05/21 0906     Visit Number 9    Date for PT Re-Evaluation 07/06/21    Authorization Type [employer]    Authorization - Visit Number 9    Authorization - Number of Visits 60    PT Start Time 0800    PT Stop Time 0850    PT Time Calculation (min) 50 min    Activity Tolerance Patient tolerated treatment well;Patient limited by pain    Behavior During Therapy [facility] for tasks assessed/performed             History reviewed. No pertinent past medical history.  Past Surgical History:  Procedure Laterality Date   WISDOM TOOTH EXTRACTION  2017    There were no vitals filed for this visit.   Subjective Assessment - 04/05/21 0854     Subjective Pt reports she has had a lot of anxiety about upcoming polp removal on 11/7 and overall follow up appointment since finding it and thinks this has caused increased pelvic tension and limited progress with dilator sizing and intimacy with husband. Pt upset that she hasn't progressed at the same pace as with previous dilators    Currently in Pain? No/denies                [facility] PT Assessment - 04/05/21 0001       Assessment   Medical Diagnosis N94.10 (ICD-10-CM) - Dyspareunia, female. N94.2 (ICD-10-CM) - Vaginismus    Referring Provider (PT) Jerene Bears, MD    Onset Date/Surgical Date --   chronic, years   Prior Therapy none                        Pelvic Floor Special Questions - 04/05/21 0001     Pelvic Floor Internal Exam pt identified, patient confirms consent for PT to perform inernal soft tissue work and muscle strength and integrity assessment    Exam Type  Vaginal    Tone increased; session limited due to increased tone at vaginal opening and pain               [facility] Adult PT Treatment/Exercise - 04/05/21 0001       Self-Care   Self-Care Other Self-Care Comments    Other Self-Care Comments  Pt educated on different techniques with pelvic relaxation from basic self care, HEP, working out/stretching, to internal gentle stretching at vaginal opening with fingers or retuning to more comfortable dilator sizing. Pt reeducated on no progressing with dilator if pelvic pain is higher than 3-4/10 and pt reports she had been focused on progressing in the same time frame as other sizes and thinks she was having higher pain levels than this and making everything tigher. Pt also educated on attempting other positions to see if something is more comfortable for her.      Neuro Re-ed    Neuro Re-ed Details  Pt directed in diaphragmatic breathing with attempts at internal treatment this date, then 2x10 diaphragmatic breathing without any attempts at internal treatment just with pt clothes on table to downregulate system and provide global relaxation.      Manual Therapy  Manual Therapy Internal Pelvic Floor    Internal Pelvic Floor Pt requested to have PT attempt dilator placement to see if she could improve pelvic tightness with PT guiding diaphragmatic breathing and relaxation technqiues, pt denied pain however with attempting insertion of size 2 (from pt's personal set) she demonstrated facial grimacing and PT stopped the attepmt at insertion and then pt reported yes she was having pain, pt requested lower sizing and had similar results so PT halted treatment to discuss with pt importance of stopping when pain over 3-4/10 and to take breaks with dilator use to allow body to relax and decrease pain and pt agreed.                     PT Education - 04/05/21 0904     Education Details Pt educated on relaxation techniques to downregulate pelvic  tone, breathing mechanics, and to halt dilator use at home if pain over 3-4/10, and attempts to try overall relaxation techniques with and without relation to pelvic floor as needed and pt reports she would like this.    Person(s) Educated Patient    Methods Explanation;Demonstration;Tactile cues;Verbal cues;Handout    Comprehension Returned demonstration;Verbalized understanding              PT Short Term Goals - 04/05/21 0926       PT SHORT TERM GOAL #1   Title pt to be I with HEP    Time 4    Period Weeks    Status Achieved    Target Date 12/18/20      PT SHORT TERM GOAL #2   Title pt to demonstrate improved ROM at Bil hamstrings and external rotation to no more than 15% limitations    Baseline 25% limitations    Time 4    Period Weeks    Status Achieved    Target Date 12/18/20      PT SHORT TERM GOAL #3   Title pt to report improved tolerance to no more than 3/10 pain to level one dialator with penetration vaginally for improved tolerance to vaginal exam    Time 6    Period Weeks    Status Achieved    Target Date 01/01/21               PT Long Term Goals - 04/05/21 0927       PT LONG TERM GOAL #1   Title pt to be I with advanced HEP    Time 8    Period Weeks    Status Achieved      PT LONG TERM GOAL #2   Title pt to report no more than 4/10 pain with level 3 dialator with vaginal penetration    Time 8    Period Weeks    Status Achieved      PT LONG TERM GOAL #3   Title pt to demonstrate no more than 10% ROM limtations at Bil hip external rotation and hamstrings    Time 8    Period Weeks    Status Achieved      PT LONG TERM GOAL #4   Title pt to report no more than 2/10 pain with level 6 dialator or equivalent to decrease pain with vaginal penetration    Baseline unable to attempt size 4    Time 12    Period Weeks    Status On-going      PT LONG TERM GOAL #5   Title pt to demonstrate  ability to tolerate full internal exam to best assess  pelvic floor contract/relax ability with proper coodination    Time 12    Period Weeks    Status Achieved                   Plan - 04/05/21 0907     Clinical Impression Statement Pt presents to clinic with increased anxiety about lack of progress since last visit with vaginal dilator use at home and inability to progress with intercourse with spouse. Pt had messages PT several times prior to session about this and educated to try taking a break from dilator use until returning to clinic and pt completed this for the past couple of days. pt reports she was able to insert vaginal dilator size 2 in her second set (larget set) minus final 1/2 inch without pain however with added stress of recent finding of polp at cervix after first pelvic exam at [facility] office she feels she has had more tightness and inability to relax. Pt decreased sizing to first size in that set and able to to use it at home however did attempt intercourse with husband and had too much pain to insert. Pt reviewed this as "I failed at this too". Pt reports her removal of polp is 11/7 and educated to take a break from dilators at home until a few days after appointment to decrease stress. Pt session focused on education on techniques for overall pelvic and global relaxation, self care to relax at home, breathing techniques, HEP updated, and pt educated on importance of allowing herself breaks with training at home and time with intimacy as wanted with spouse outside of PT dilator use if this is wanted from pt which she confirmed she did. Pt also educated to return to every other week as able/if needed to improve progression overall with decreased pelvic pain for improved tolerance to vaginal penetration. Pt continues to demonstrate need of PT for improved pain with vaginal penetration for intercourse.    Personal Factors and Comorbidities Time since onset of injury/illness/exacerbation;Sex    Examination-Activity Limitations Other     Examination-Participation Restrictions Interpersonal Relationship    Stability/Clinical Decision Making Stable/Uncomplicated    Rehab Potential Good    PT Frequency Biweekly    PT Treatment/Interventions ADLs/Self Care Home Management;Functional mobility training;Therapeutic activities;Neuromuscular re-education;Manual techniques;Patient/family education;Passive range of motion;Dry needling;Energy conservation    PT Next Visit Plan relaxation with mobility, hips/glutes manual    PT Home Exercise Plan Access Code [email or web address]    Consulted and Agree with Plan of Care Patient             Patient will benefit from skilled therapeutic intervention in order to improve the following deficits and impairments:  Decreased coordination, Decreased endurance, Decreased activity tolerance, Decreased mobility, Decreased strength, Postural dysfunction, Improper body mechanics, Impaired flexibility, Decreased range of motion, Impaired tone, Pain, Increased muscle spasms  Visit Diagnosis: Cramp and spasm - Plan: PT plan of care cert/re-cert  Lack of coordination - Plan: PT plan of care cert/re-cert  Muscle weakness (generalized) - Plan: PT plan of care cert/re-cert     Problem List Patient Active Problem List   Diagnosis Date Noted   Dyspareunia, female 10/09/2020   Vaginismus 10/09/2020    No emotional/communication barriers or cognitive limitation. Patient is motivated to learn. Patient understands and agrees with treatment goals and plan. PT explains patient will be examined in standing, sitting, and lying down to see how their muscles and joints work.  When they are ready, they will be asked to remove their underwear so PT can examine their perineum. The patient is also given the option of providing their own chaperone as one is not provided in our facility. The patient also has the right and is explained the right to defer or refuse any part of the evaluation or treatment including the internal  exam. With the patient's consent, PT will use one gloved finger to gently assess the muscles of the pelvic floor, seeing how well it contracts and relaxes and if there is muscle symmetry. After, the patient will get dressed and PT and patient will discuss exam findings and plan of care. PT and patient discuss plan of care, schedule, attendance policy and HEP activities.   Otelia Sergeant, PT, DPT 11/04/229:31 AM   [facility] [address] [place], [place], [address] Phone: [phone]   Fax:  [phone]  Name: Barbara Whitehead MRN: [id number] Date of Birth: 07-03-1990

## 2021-04-05 NOTE — Patient Instructions (Signed)
Access Code: [email or web address] URL: [email or web address] Date: 04/05/2021 Prepared by: [facility]  Exercises Supine Diaphragmatic Breathing - 1 x daily - 7 x weekly - 1 sets - 10 reps Supine Hip Adductor Stretch - 1 x daily - 7 x weekly - 1 sets - 2-5 reps - 45 hold Supine Pelvic Floor Stretch - 1 x daily - 7 x weekly - 1 sets - 2-5 reps - 45 hold Diaphragmatic Breathing in Child's Pose with Pelvic Floor Relaxation - 1 x daily - 7 x weekly - 1 sets - 2-5 reps - 45s hold Modified Thomas Stretch - 1 x daily - 7 x weekly - 1 sets - 2-5 reps - 45s hold V Sit Hip Adductor Hamstring Stretch - 1 x daily - 7 x weekly - 1 sets - 3-5 reps - 30-45s holds Child's Pose with Sidebending - 1 x daily - 7 x weekly - 1 sets - 3-5 reps - 30-45s holds Cat Cow - 1 x daily - 7 x weekly - 1 sets - 10 reps - 5s holds 15 Minute Progressive Muscle Relaxation for Pain Relief - 1 x daily - 7 x weekly - 1 sets - 3-5 reps - 30-45s holds Supported Teacher, music with Pelvic Floor Relaxation - 1 x daily - 7 x weekly - 1 sets - 1-3 reps - 30-45s holds Pelvic Floor Relaxation Supported Sitting on [employer] - 1 x daily - 7 x weekly - 1 sets

## 2021-04-08 ENCOUNTER — Other Ambulatory Visit (HOSPITAL_COMMUNITY)
Admission: RE | Admit: 2021-04-08 | Discharge: 2021-04-08 | Disposition: A | Discharge status: Home / Self Care | Payer: BC Managed Care – PPO | Source: Ambulatory Visit | Attending: Obstetrics & Gynecology | Admitting: Obstetrics & Gynecology

## 2021-04-08 ENCOUNTER — Encounter: Payer: Self-pay | Admitting: Obstetrics & Gynecology

## 2021-04-08 ENCOUNTER — Ambulatory Visit: Payer: BC Managed Care – PPO | Admitting: Obstetrics & Gynecology

## 2021-04-08 ENCOUNTER — Other Ambulatory Visit: Payer: Self-pay

## 2021-04-08 VITALS — BP 132/90 | HR 94 | Resp 16 | Ht 64.0 in | Wt 161.0 lb

## 2021-04-08 DIAGNOSIS — N840 Polyp of corpus uteri: Secondary | ICD-10-CM | POA: Diagnosis not present

## 2021-04-08 DIAGNOSIS — N841 Polyp of cervix uteri: Secondary | ICD-10-CM | POA: Diagnosis not present

## 2021-04-08 NOTE — Progress Notes (Signed)
   Subjective:    Patient ID: Barbara Whitehead, female    DOB: 28-May-1991, 30 y.o.   MRN: [id number]  HPI  30 yo female who presents for an no cervical/endometrial polyp removal.  Patient has vaginismus and has taken Xanax prior to the procedure.  She also has several questions that she emailed me about.  Given that she has taken Xanax we will review these at her follow-up visit we will review pathology.  Patient was assured that we will stop if she feels uncomfortable.  Review of Systems  Constitutional: Negative.   Respiratory: Negative.    Cardiovascular: Negative.   Gastrointestinal: Negative.   Genitourinary: Negative.       Objective:   Physical Exam Vitals reviewed.  Constitutional:      General: She is not in acute distress.    Appearance: She is well-developed.  HENT:     Head: Normocephalic and atraumatic.  Eyes:     Conjunctiva/sclera: Conjunctivae normal.  Cardiovascular:     Rate and Rhythm: Normal rate.  Pulmonary:     Effort: Pulmonary effort is normal.  Genitourinary:    Comments: Polyp near external cervical os. Skin:    General: Skin is warm and dry.  Neurological:     Mental Status: She is alert and oriented to person, place, and time.  Psychiatric:        Mood and Affect: Mood normal.   Vitals:   04/08/21 1334  BP: 132/90  Pulse: 94  Resp: 16  Weight: 161 lb (73 kg)  Height: 5\' 4"  (1.626 m)      Assessment & Plan:  30 year old female with Endo cervical/endometrial polyp.  Vaginismus.    Endometrial Polyp Removal As noted above, patient had taken Xanax to help with relaxation and anxiety.  Patient was placed in dorsolithotomy position and lidocaine jelly was used to help with discomfort.  Patient had requested plastic speculum.  However I felt this was too tight upon first insertion.  We moved to a Peterson narrow speculum.  I placed 1 finger into the vagina and pushed down on her posterior vaginal wall to help relieve the involuntary contraction.   With deep breathing the patient was relaxed enough to insert a metal speculum without pain.  The cervix was brought into view and cleaned with Betadine.  She tenaculum was placed on the polyp and twisted until removed.  There was no bleeding after the polyp was removed.  Patient was given instructions to call 34 or go to the emergency room if heavy bleeding, fever, or abdominal pain occurs  Patient will come back in 1 to 2 weeks to review pathology and answer her questions.

## 2021-04-09 DIAGNOSIS — F4323 Adjustment disorder with mixed anxiety and depressed mood: Secondary | ICD-10-CM | POA: Diagnosis not present

## 2021-04-09 DIAGNOSIS — F4320 Adjustment disorder, unspecified: Secondary | ICD-10-CM | POA: Diagnosis not present

## 2021-04-10 LAB — SURGICAL PATHOLOGY

## 2021-04-15 ENCOUNTER — Other Ambulatory Visit: Payer: Self-pay

## 2021-04-15 ENCOUNTER — Encounter: Payer: Self-pay | Admitting: Obstetrics & Gynecology

## 2021-04-15 ENCOUNTER — Ambulatory Visit: Payer: BC Managed Care – PPO | Admitting: Physical Therapy

## 2021-04-15 ENCOUNTER — Encounter: Payer: BC Managed Care – PPO | Admitting: Obstetrics & Gynecology

## 2021-04-15 ENCOUNTER — Telehealth: Payer: Self-pay | Admitting: *Deleted

## 2021-04-15 ENCOUNTER — Telehealth (INDEPENDENT_AMBULATORY_CARE_PROVIDER_SITE_OTHER): Payer: BC Managed Care – PPO | Admitting: Obstetrics & Gynecology

## 2021-04-15 DIAGNOSIS — N921 Excessive and frequent menstruation with irregular cycle: Secondary | ICD-10-CM | POA: Diagnosis not present

## 2021-04-15 DIAGNOSIS — R279 Unspecified lack of coordination: Secondary | ICD-10-CM | POA: Diagnosis not present

## 2021-04-15 DIAGNOSIS — N840 Polyp of corpus uteri: Secondary | ICD-10-CM

## 2021-04-15 DIAGNOSIS — N941 Unspecified dyspareunia: Secondary | ICD-10-CM

## 2021-04-15 DIAGNOSIS — R252 Cramp and spasm: Secondary | ICD-10-CM | POA: Diagnosis not present

## 2021-04-15 DIAGNOSIS — M6281 Muscle weakness (generalized): Secondary | ICD-10-CM

## 2021-04-15 DIAGNOSIS — N942 Vaginismus: Secondary | ICD-10-CM

## 2021-04-15 DIAGNOSIS — R635 Abnormal weight gain: Secondary | ICD-10-CM

## 2021-04-15 MED ORDER — LIDOCAINE 5 % EX OINT
1.0000 | TOPICAL_OINTMENT | CUTANEOUS | 1 refills | Status: DC | PRN
Start: 2021-04-15 — End: 2023-07-03

## 2021-04-15 NOTE — Telephone Encounter (Signed)
Left patient an urgent message to call the office to pay co-pay over the phone or to pay through MyChart if possible.

## 2021-04-15 NOTE — Progress Notes (Signed)
GYNECOLOGY VIRTUAL VISIT ENCOUNTER NOTE  Provider location: Center for [facility] [place]   Patient location: Home  I connected with Barbara Whitehead on 04/15/21 at 10:10 AM EST by MyChart Video Encounter and verified that I am speaking with the correct person using two identifiers.   I discussed the limitations, risks, security and privacy concerns of performing an evaluation and management service virtually and the availability of in person appointments. I also discussed with the patient that there may be a patient responsible charge related to this service. The patient expressed understanding and agreed to proceed.   History:  Barbara Whitehead is a 30 y.o. G0P0000 female being evaluated today for follow-up after endometrial polyp removal.  Patient doing well today.  See assessment and plan for more topics discussed today and associated diagnoses and plans.   No past medical history on file. Past Surgical History:  Procedure Laterality Date   WISDOM TOOTH EXTRACTION  2017   The following portions of the patient's history were reviewed and updated as appropriate: allergies, current medications, past family history, past medical history, past social history, past surgical history and problem list.   Health Maintenance:  Normal pap and negative HRHPV on 03/04/21.    Review of Systems:  Pertinent items noted in HPI and remainder of comprehensive ROS otherwise negative.  Physical Exam:   General:  Alert, oriented and cooperative. Patient appears to be in no acute distress.  Mental Status: Normal mood and affect. Normal behavior. Normal judgment and thought content.   Respiratory: Normal respiratory effort, no problems with respiration noted  Rest of physical exam deferred due to type of encounter  Labs and Imaging Results for orders placed or performed in visit on 04/08/21 (from the past 336 hour(s))  Surgical pathology( [place]/ [facility])   Collection Time:  04/08/21  2:15 PM  Result Value Ref Range   SURGICAL PATHOLOGY      SURGICAL PATHOLOGY CASE: [id number] PATIENT: [facility] Surgical Pathology Report     Clinical History: cervical polyp, abnormal vaginal bleeding (cm)     FINAL MICROSCOPIC DIAGNOSIS:  A. CERVIX, POLYPECTOMY:     - Endocervical polyp, 1.7 cm.     - No evidence of malignancy.   GROSS DESCRIPTION:  Specimen is received in formalin and consists of a 1.7 x 1.1 x 0.6 cm polypoid piece of tan-red soft tissue.  The specimen is bisected and entirely submitted in 1 cassette.  Lovey Newcomer 04/09/2021)    Final Diagnosis performed by Zella Ball, MD.   Electronically signed 04/10/2021 Technical and / or Professional components performed at [facility]. [facility], [address] [place], [place] [address].  Immunohistochemistry Technical component (if applicable) was performed at [facility]. [address], [place], [place] [address].   IMMUNOHISTOCHEMISTRY DISCLAIMER (if applicable): Some of these immunohistochemical stains m ay have been developed and the performance characteristics determine by [facility]. Some may not have been cleared or approved by the U.S. Food and Drug Administration. The FDA has determined that such clearance or approval is not necessary. This test is used for clinical purposes. It should not be regarded as investigational or for research. This laboratory is certified under the Clinical Laboratory Improvement Amendments of 1988 (CLIA-88) as qualified to perform high complexity clinical laboratory testing.  The controls stained appropriately.    US PELVIC COMPLETE WITH TRANSVAGINAL  Result Date: 03/26/2021 CLINICAL DATA:  Cervical polyp, LMP 03/08/2021. History of pelvic floor pain EXAM:  TRANSABDOMINAL AND TRANSVAGINAL ULTRASOUND OF PELVIS TECHNIQUE: Both transabdominal and transvaginal ultrasound examinations of the pelvis were  performed. Transabdominal technique was performed for global imaging of the pelvis including uterus, ovaries, adnexal regions, and pelvic cul-de-sac. It was necessary to proceed with endovaginal exam following the transabdominal exam to visualize the uterus, endometrium, ovaries, and cervix. COMPARISON:  None FINDINGS: Uterus Measurements: 8.2 x 3.2 x 4.4 cm = volume: 60 mL. Anteverted. Slightly heterogeneous myometrium. No focal mass. Endometrium Thickness: 14 mm. No endometrial fluid or mass. Abnormal appearance of endocervical canal, distended by a soft tissue nodule 16 x 22 x 19 mm in size, appearing to extend towards the lower uterine segment. This could represent a cervical polyp or a prolapsed endometrial polyp on a stalk. Small amount of endocervical fluid is noted. Right ovary Measurements: 4.1 x 1.4 x 2.5 cm = volume: 7.8 mL. Normal morphology without mass Left ovary Not visualized, likely obscured by bowel Other findings Trace free pelvic fluid.  No adnexal masses. IMPRESSION: Nonvisualization of LEFT ovary. 16 x 22 x 19 mm soft tissue nodule within the endocervical canal, question cervical polyp or prolapsed endometrial polyp on a stalk. Remainder of exam unremarkable. Electronically Signed   By: Ulyses Southward M.D.   On: 03/26/2021 17:36       Assessment and Plan:   Endometrial polyp--removed and benign Weight gain--PCP checked labs--per pat no evidence of metabolic syndrome.  Checking to see if TSH was drawn; advised to write down everything she eats for a few days and see her caloric intake.  Increasing exercise is always beneficial as well. Patient will monitor her menstrual cycles to see if they are wider.  Intermenstrual spotting should also disappear as the polyp is now gone.  Patient will contact us if not better Vaginismus.  Continue physical therapy.  Patient advised to read the book come as you are.  Lidocaine ointment prescribed for dyspareunia.    30 minutes spent reviewing records,  with the patient during the visit, counseling, and documentation. I discussed the assessment and treatment plan with the patient. The patient was provided an opportunity to ask questions and all were answered. The patient agreed with the plan and demonstrated an understanding of the instructions.   The patient was advised to call back or seek an in-person evaluation/go to the ED if the symptoms worsen or if the condition fails to improve as anticipated.    Elsie Lincoln, MD Center for [employer]

## 2021-04-15 NOTE — Therapy (Signed)
[facility] @ [facility] [address] [place], [place], [address] Phone: [phone]   Fax:  [phone]  Physical Therapy Treatment  Patient Details  Name: Barbara Whitehead MRN: [id number] Date of Birth: Jul 20, 1990 Referring Provider (PT): Jerene Bears, MD   Encounter Date: 04/15/2021   PT End of Session - 04/15/21 1644     Visit Number 10    Date for PT Re-Evaluation 07/06/21    Authorization Type BCBS    Authorization - Visit Number 10    Authorization - Number of Visits 60    PT Start Time 1529    PT Stop Time 1613    PT Time Calculation (min) 44 min    Activity Tolerance Patient tolerated treatment well    Behavior During Therapy [facility] for tasks assessed/performed             No past medical history on file.  Past Surgical History:  Procedure Laterality Date   WISDOM TOOTH EXTRACTION  2017    There were no vitals filed for this visit.   Subjective Assessment - 04/15/21 1529     Subjective Pt reports biospt came back normal, now that this stress has decreased she has been able to have improvements with dilators again. Pt now using straighter dilator instead of curved one she had previously which was causing pain. Pt now able to use increased size second in larger set. Pt was able to tolerate vaginal penetration 2/10 pain with penile penetration.    Patient Stated Goals to have less pain with vaginal penetration    Currently in Pain? No/denies                               [facility] Adult PT Treatment/Exercise - 04/15/21 0001       Self-Care   Self-Care Other Self-Care Comments    Other Self-Care Comments  Pt educated on progressions with relaxation at pelvis for improved tolerance to intercourse with decreased pain      Exercises   Exercises Lumbar;Knee/Hip      Lumbar Exercises: Stretches   Active Hamstring Stretch Right;Left;30 seconds    Single Knee to Chest Stretch Right;Left;30 seconds     Pelvic Tilt 10 reps    Piriformis Stretch Right;Left;30 seconds    Other Lumbar Stretch Exercise cobra stretch 45s; happy baby 45s; childs pose 45s; lateral childs pose 45s each; pigeon pose 45s                     PT Education - 04/15/21 1644     Education Details Pt educated on relaxation techniques in various positions to improve tolerance to intercourse with decreased pain.    Person(s) Educated Patient    Methods Explanation;Demonstration;Tactile cues;Verbal cues    Comprehension Returned demonstration;Verbalized understanding              PT Short Term Goals - 04/05/21 0926       PT SHORT TERM GOAL #1   Title pt to be I with HEP    Time 4    Period Weeks    Status Achieved    Target Date 12/18/20      PT SHORT TERM GOAL #2   Title pt to demonstrate improved ROM at Bil hamstrings and external rotation to no more than 15% limitations    Baseline 25% limitations    Time 4    Period Weeks  Status Achieved    Target Date 12/18/20      PT SHORT TERM GOAL #3   Title pt to report improved tolerance to no more than 3/10 pain to level one dialator with penetration vaginally for improved tolerance to vaginal exam    Time 6    Period Weeks    Status Achieved    Target Date 01/01/21               PT Long Term Goals - 04/05/21 0927       PT LONG TERM GOAL #1   Title pt to be I with advanced HEP    Time 8    Period Weeks    Status Achieved      PT LONG TERM GOAL #2   Title pt to report no more than 4/10 pain with level 3 dialator with vaginal penetration    Time 8    Period Weeks    Status Achieved      PT LONG TERM GOAL #3   Title pt to demonstrate no more than 10% ROM limtations at Bil hip external rotation and hamstrings    Time 8    Period Weeks    Status Achieved      PT LONG TERM GOAL #4   Title pt to report no more than 2/10 pain with level 6 dialator or equivalent to decrease pain with vaginal penetration    Baseline unable to  attempt size 4    Time 12    Period Weeks    Status On-going      PT LONG TERM GOAL #5   Title pt to demonstrate ability to tolerate full internal exam to best assess pelvic floor contract/relax ability with proper coodination    Time 12    Period Weeks    Status Achieved                   Plan - 04/15/21 1645     Clinical Impression Statement Pt presents to clinic in much better spirits, biopsy (-) and feels like her "setback" last session was because of stress of this. Pt reports she has had much better progress since this and getting her results, has been using dilator/vibrator for improved tolerance to intercourse and stretching internally. Pt able to have penetrative intercourse with husband for first time last week with 1/10 pain at full penile penetration however not able to tolerate mobility. Pt session focused on education on progressions as able with intercourse, vaginal dilators/vibrator use to improve tissue mobility and decrease hypertonicity at pelvic floor for improved tolerance to intercourse and hip and pelvic floor stretches this session. Pt continues to demonstrate need of PT for improved pain with vaginal penetration for intercourse.    Personal Factors and Comorbidities Time since onset of injury/illness/exacerbation;Sex    Examination-Activity Limitations Other    Examination-Participation Restrictions Interpersonal Relationship    Stability/Clinical Decision Making Stable/Uncomplicated    Rehab Potential Good    PT Frequency Biweekly    PT Treatment/Interventions ADLs/Self Care Home Management;Functional mobility training;Therapeutic activities;Neuromuscular re-education;Manual techniques;Patient/family education;Passive range of motion;Dry needling;Energy conservation    PT Next Visit Plan relaxation with mobility, hips/glutes manual    PT Home Exercise Plan Access Code [email or web address]    Consulted and Agree with Plan of Care Patient             Patient will  benefit from skilled therapeutic intervention in order to improve the following deficits and impairments:  Decreased coordination,  Decreased endurance, Decreased activity tolerance, Decreased mobility, Decreased strength, Postural dysfunction, Improper body mechanics, Impaired flexibility, Decreased range of motion, Impaired tone, Pain, Increased muscle spasms  Visit Diagnosis: Cramp and spasm  Lack of coordination  Muscle weakness (generalized)     Problem List Patient Active Problem List   Diagnosis Date Noted   Dyspareunia, female 10/09/2020   Vaginismus 10/09/2020    Barbaraann Faster, PT 04/15/2021, 4:48 PM  [facility] @ [facility] [address] [place], [place], [address] Phone: [phone]   Fax:  [phone]  Name: Barbara Whitehead MRN: [id number] Date of Birth: April 16, 1991

## 2021-04-19 DIAGNOSIS — F4320 Adjustment disorder, unspecified: Secondary | ICD-10-CM | POA: Diagnosis not present

## 2021-05-01 DIAGNOSIS — F4323 Adjustment disorder with mixed anxiety and depressed mood: Secondary | ICD-10-CM | POA: Diagnosis not present

## 2021-05-02 DIAGNOSIS — F4320 Adjustment disorder, unspecified: Secondary | ICD-10-CM | POA: Diagnosis not present

## 2021-05-07 ENCOUNTER — Ambulatory Visit: Payer: BC Managed Care – PPO | Admitting: Physical Therapy

## 2021-05-07 DIAGNOSIS — F4320 Adjustment disorder, unspecified: Secondary | ICD-10-CM | POA: Diagnosis not present

## 2021-05-14 ENCOUNTER — Encounter: Payer: BC Managed Care – PPO | Admitting: Physical Therapy

## 2021-05-15 DIAGNOSIS — F4320 Adjustment disorder, unspecified: Secondary | ICD-10-CM | POA: Diagnosis not present

## 2021-05-16 DIAGNOSIS — F4323 Adjustment disorder with mixed anxiety and depressed mood: Secondary | ICD-10-CM | POA: Diagnosis not present

## 2021-05-21 DIAGNOSIS — F4320 Adjustment disorder, unspecified: Secondary | ICD-10-CM | POA: Diagnosis not present

## 2021-05-28 DIAGNOSIS — F4320 Adjustment disorder, unspecified: Secondary | ICD-10-CM | POA: Diagnosis not present

## 2021-06-04 DIAGNOSIS — F4320 Adjustment disorder, unspecified: Secondary | ICD-10-CM | POA: Diagnosis not present

## 2021-06-12 DIAGNOSIS — F4320 Adjustment disorder, unspecified: Secondary | ICD-10-CM | POA: Diagnosis not present

## 2021-06-13 DIAGNOSIS — F4323 Adjustment disorder with mixed anxiety and depressed mood: Secondary | ICD-10-CM | POA: Diagnosis not present

## 2021-06-18 ENCOUNTER — Ambulatory Visit: Payer: BC Managed Care – PPO | Attending: Obstetrics & Gynecology | Admitting: Physical Therapy

## 2021-06-18 ENCOUNTER — Encounter: Payer: Self-pay | Admitting: Obstetrics & Gynecology

## 2021-06-18 ENCOUNTER — Other Ambulatory Visit: Payer: Self-pay

## 2021-06-18 DIAGNOSIS — R252 Cramp and spasm: Secondary | ICD-10-CM | POA: Diagnosis not present

## 2021-06-18 DIAGNOSIS — M6281 Muscle weakness (generalized): Secondary | ICD-10-CM | POA: Insufficient documentation

## 2021-06-18 DIAGNOSIS — R279 Unspecified lack of coordination: Secondary | ICD-10-CM | POA: Insufficient documentation

## 2021-06-18 NOTE — Therapy (Signed)
[facility] @ [facility], [place], [address] Phone: [phone]   Fax:  [phone]  Physical Therapy Treatment  Patient Details  Name: Barbara Whitehead MRN: [id number] Date of Birth: August 23, 1990 Referring Provider (PT): Megan Salon, MD   Encounter Date: 06/18/2021   PT End of Session - 06/18/21 1734     Visit Number 11    Date for PT Re-Evaluation 07/06/21    Authorization Type BCBS    Authorization - Visit Number --    Authorization - Number of Visits 60    PT Start Time [id number]    PT Stop Time 1615    PT Time Calculation (min) 45 min    Activity Tolerance Patient tolerated treatment well;Patient limited by pain    Behavior During Therapy [facility] for tasks assessed/performed             No past medical history on file.  Past Surgical History:  Procedure Laterality Date   WISDOM TOOTH EXTRACTION  2017    There were no vitals filed for this visit.   Subjective Assessment - 06/18/21 1534     Subjective Pt reports she has taken a break from any attempts at internal vaginal penetration as this was stressing her a bit.    Patient Stated Goals to have less pain with vaginal penetration    Currently in Pain? No/denies                            Pelvic Floor Special Questions - 06/18/21 0001     External Perineal Exam Attempted internal with pt consent however pt immediately tensed bil thighs and core; PT did not attempt internal due to pt's tension and pt agreed to hold on this due to being fearful of pain.    Pelvic Floor Internal Exam pt identified, patient confirms consent for PT to perform inernal soft tissue work and muscle strength and integrity assessment    Exam Type Vaginal               [facility] Adult PT Treatment/Exercise - 06/18/21 0001       Self-Care   Self-Care Other Self-Care Comments    Other Self-Care Comments  Pt educated on attempting to return to vaginal dilating  with tolerance without pain higher than 4/10.                     PT Education - 06/18/21 1733     Education Details Pt educated on frequency of vaginal dilator use for home, pelvic relaxation, global relaxation and return to HEP.    Person(s) Educated Patient    Methods Explanation;Demonstration;Tactile cues;Verbal cues    Comprehension Returned demonstration;Verbalized understanding              PT Short Term Goals - 04/05/21 0926       PT SHORT TERM GOAL #1   Title pt to be I with HEP    Time 4    Period Weeks    Status Achieved    Target Date 12/18/20      PT SHORT TERM GOAL #2   Title pt to demonstrate improved ROM at Bil hamstrings and external rotation to no more than 15% limitations    Baseline 25% limitations    Time 4    Period Weeks    Status Achieved    Target Date 12/18/20      PT  SHORT TERM GOAL #3   Title pt to report improved tolerance to no more than 3/10 pain to level one dialator with penetration vaginally for improved tolerance to vaginal exam    Time 6    Period Weeks    Status Achieved    Target Date 01/01/21               PT Long Term Goals - 04/05/21 0927       PT LONG TERM GOAL #1   Title pt to be I with advanced HEP    Time 8    Period Weeks    Status Achieved      PT LONG TERM GOAL #2   Title pt to report no more than 4/10 pain with level 3 dialator with vaginal penetration    Time 8    Period Weeks    Status Achieved      PT LONG TERM GOAL #3   Title pt to demonstrate no more than 10% ROM limtations at Bil hip external rotation and hamstrings    Time 8    Period Weeks    Status Achieved      PT LONG TERM GOAL #4   Title pt to report no more than 2/10 pain with level 6 dialator or equivalent to decrease pain with vaginal penetration    Baseline unable to attempt size 4    Time 12    Period Weeks    Status On-going      PT LONG TERM GOAL #5   Title pt to demonstrate ability to tolerate full internal  exam to best assess pelvic floor contract/relax ability with proper coodination    Time 12    Period Weeks    Status Achieved                   Plan - 06/18/21 1736     Clinical Impression Statement Pt reports to clinic without any attempts at vaginal penetration for 2 months reporting she was stressed with keeping up with vaginal dilator use, stretching, and still having tightness. Pt reported she was able to insert vibrator for increased size progess (larger than largest dilator) to improve tolerance to partner for painfree vaginal penetration for intercourse, and did not have pain with this, did have one instance of intercourse with partner however due to stress has not attempted anything for these two months. Pt consented to attempt internal assessment with PT this session however unable to complete to tension and tightness externally. Pt educated on frequency for vaginal dilator use at home, pelvic relaxation, and HEP and as agreeable or with interest including partner. Pt agreeable to this to improve tolerance to vaginal penetration. Pt requests frequency of once a month and to attempt more progress at home with spouse.    Personal Factors and Comorbidities Time since onset of injury/illness/exacerbation;Sex    Examination-Activity Limitations Other    Examination-Participation Restrictions Interpersonal Relationship    Stability/Clinical Decision Making Stable/Uncomplicated    Rehab Potential Good    PT Frequency Biweekly    PT Treatment/Interventions ADLs/Self Care Home Management;Functional mobility training;Therapeutic activities;Neuromuscular re-education;Manual techniques;Patient/family education;Passive range of motion;Dry needling;Energy conservation;Aquatic Therapy    PT Next Visit Plan relaxation with mobility, hips/glutes manual    PT Home Exercise Plan Access Code [email or web address]    Consulted and Agree with Plan of Care Patient             Patient will benefit from  skilled therapeutic intervention in order  to improve the following deficits and impairments:  Decreased coordination, Decreased endurance, Decreased activity tolerance, Decreased mobility, Decreased strength, Postural dysfunction, Improper body mechanics, Impaired flexibility, Decreased range of motion, Impaired tone, Pain, Increased muscle spasms  Visit Diagnosis: Muscle weakness (generalized)  Lack of coordination  Cramp and spasm     Problem List Patient Active Problem List   Diagnosis Date Noted   Dyspareunia, female 10/09/2020   Vaginismus 10/09/2020    No emotional/communication barriers or cognitive limitation. Patient is motivated to learn. Patient understands and agrees with treatment goals and plan. PT explains patient will be examined in standing, sitting, and lying down to see how their muscles and joints work. When they are ready, they will be asked to remove their underwear so PT can examine their perineum. The patient is also given the option of providing their own chaperone as one is not provided in our facility. The patient also has the right and is explained the right to defer or refuse any part of the evaluation or treatment including the internal exam. With the patient's consent, PT will use one gloved finger to gently assess the muscles of the pelvic floor, seeing how well it contracts and relaxes and if there is muscle symmetry. After, the patient will get dressed and PT and patient will discuss exam findings and plan of care. PT and patient discuss plan of care, schedule, attendance policy and HEP activities.   Stacy Gardner, PT, DPT 01/17/235:41 PM   [facility] @ [facility] [place], [place], [address] Phone: [phone]   Fax:  [phone]  Name: Natarsha Sammons MRN: [id number] Date of Birth: 07/14/1990

## 2021-06-19 DIAGNOSIS — F4320 Adjustment disorder, unspecified: Secondary | ICD-10-CM | POA: Diagnosis not present

## 2021-06-20 ENCOUNTER — Other Ambulatory Visit: Payer: Self-pay | Admitting: Obstetrics & Gynecology

## 2021-06-20 ENCOUNTER — Encounter: Payer: Self-pay | Admitting: Obstetrics & Gynecology

## 2021-06-20 ENCOUNTER — Telehealth: Payer: Self-pay | Admitting: *Deleted

## 2021-06-20 MED ORDER — DIAZEPAM 5 MG PO TABS: ORAL_TABLET | ORAL | 0 refills | Status: AC

## 2021-06-20 NOTE — Telephone Encounter (Signed)
Left patient a message to call and schedule 2 week F/U appointment with Dr. Penne Lash for 07/08/2021 or after.

## 2021-06-20 NOTE — Progress Notes (Signed)
Barbara Whitehead is attending pelvic PT and needing pharmacologic assistance.  Will try valium vaginal suppositories with check in office after 2 weeks.

## 2021-06-26 DIAGNOSIS — F4320 Adjustment disorder, unspecified: Secondary | ICD-10-CM | POA: Diagnosis not present

## 2021-06-27 DIAGNOSIS — F4323 Adjustment disorder with mixed anxiety and depressed mood: Secondary | ICD-10-CM | POA: Diagnosis not present

## 2021-07-03 DIAGNOSIS — F4320 Adjustment disorder, unspecified: Secondary | ICD-10-CM | POA: Diagnosis not present

## 2021-07-10 DIAGNOSIS — F4320 Adjustment disorder, unspecified: Secondary | ICD-10-CM | POA: Diagnosis not present

## 2021-07-17 DIAGNOSIS — F4320 Adjustment disorder, unspecified: Secondary | ICD-10-CM | POA: Diagnosis not present

## 2021-07-24 ENCOUNTER — Encounter: Payer: Self-pay | Admitting: Obstetrics & Gynecology

## 2021-07-24 ENCOUNTER — Encounter (HOSPITAL_BASED_OUTPATIENT_CLINIC_OR_DEPARTMENT_OTHER): Payer: Self-pay | Admitting: Obstetrics & Gynecology

## 2021-07-24 DIAGNOSIS — F4320 Adjustment disorder, unspecified: Secondary | ICD-10-CM | POA: Diagnosis not present

## 2021-07-25 DIAGNOSIS — F4323 Adjustment disorder with mixed anxiety and depressed mood: Secondary | ICD-10-CM | POA: Diagnosis not present

## 2021-07-30 DIAGNOSIS — N942 Vaginismus: Secondary | ICD-10-CM | POA: Diagnosis not present

## 2021-07-30 DIAGNOSIS — Z3009 Encounter for other general counseling and advice on contraception: Secondary | ICD-10-CM | POA: Diagnosis not present

## 2021-07-31 DIAGNOSIS — F4320 Adjustment disorder, unspecified: Secondary | ICD-10-CM | POA: Diagnosis not present

## 2021-08-07 DIAGNOSIS — F4320 Adjustment disorder, unspecified: Secondary | ICD-10-CM | POA: Diagnosis not present

## 2021-08-14 DIAGNOSIS — F4320 Adjustment disorder, unspecified: Secondary | ICD-10-CM | POA: Diagnosis not present

## 2021-08-21 DIAGNOSIS — F4320 Adjustment disorder, unspecified: Secondary | ICD-10-CM | POA: Diagnosis not present

## 2021-08-28 DIAGNOSIS — F4320 Adjustment disorder, unspecified: Secondary | ICD-10-CM | POA: Diagnosis not present

## 2021-09-04 DIAGNOSIS — F4320 Adjustment disorder, unspecified: Secondary | ICD-10-CM | POA: Diagnosis not present

## 2021-09-11 DIAGNOSIS — F4320 Adjustment disorder, unspecified: Secondary | ICD-10-CM | POA: Diagnosis not present

## 2021-09-12 DIAGNOSIS — F4323 Adjustment disorder with mixed anxiety and depressed mood: Secondary | ICD-10-CM | POA: Diagnosis not present

## 2021-09-18 DIAGNOSIS — F4320 Adjustment disorder, unspecified: Secondary | ICD-10-CM | POA: Diagnosis not present

## 2021-09-25 DIAGNOSIS — F330 Major depressive disorder, recurrent, mild: Secondary | ICD-10-CM | POA: Diagnosis not present

## 2021-10-02 DIAGNOSIS — F330 Major depressive disorder, recurrent, mild: Secondary | ICD-10-CM | POA: Diagnosis not present

## 2021-10-09 DIAGNOSIS — F330 Major depressive disorder, recurrent, mild: Secondary | ICD-10-CM | POA: Diagnosis not present

## 2021-10-10 DIAGNOSIS — F4323 Adjustment disorder with mixed anxiety and depressed mood: Secondary | ICD-10-CM | POA: Diagnosis not present

## 2021-10-14 DIAGNOSIS — N942 Vaginismus: Secondary | ICD-10-CM | POA: Diagnosis not present

## 2021-10-14 DIAGNOSIS — Z3169 Encounter for other general counseling and advice on procreation: Secondary | ICD-10-CM | POA: Diagnosis not present

## 2021-10-16 DIAGNOSIS — F330 Major depressive disorder, recurrent, mild: Secondary | ICD-10-CM | POA: Diagnosis not present

## 2021-10-23 DIAGNOSIS — F330 Major depressive disorder, recurrent, mild: Secondary | ICD-10-CM | POA: Diagnosis not present

## 2021-10-30 DIAGNOSIS — F330 Major depressive disorder, recurrent, mild: Secondary | ICD-10-CM | POA: Diagnosis not present

## 2021-11-06 DIAGNOSIS — F330 Major depressive disorder, recurrent, mild: Secondary | ICD-10-CM | POA: Diagnosis not present

## 2021-11-12 DIAGNOSIS — F330 Major depressive disorder, recurrent, mild: Secondary | ICD-10-CM | POA: Diagnosis not present

## 2021-11-19 DIAGNOSIS — F330 Major depressive disorder, recurrent, mild: Secondary | ICD-10-CM | POA: Diagnosis not present

## 2021-11-27 DIAGNOSIS — F330 Major depressive disorder, recurrent, mild: Secondary | ICD-10-CM | POA: Diagnosis not present

## 2021-12-11 DIAGNOSIS — F330 Major depressive disorder, recurrent, mild: Secondary | ICD-10-CM | POA: Diagnosis not present

## 2021-12-18 DIAGNOSIS — F330 Major depressive disorder, recurrent, mild: Secondary | ICD-10-CM | POA: Diagnosis not present

## 2021-12-26 DIAGNOSIS — F330 Major depressive disorder, recurrent, mild: Secondary | ICD-10-CM | POA: Diagnosis not present

## 2022-01-01 DIAGNOSIS — F330 Major depressive disorder, recurrent, mild: Secondary | ICD-10-CM | POA: Diagnosis not present

## 2022-01-08 DIAGNOSIS — F330 Major depressive disorder, recurrent, mild: Secondary | ICD-10-CM | POA: Diagnosis not present

## 2022-01-15 DIAGNOSIS — F330 Major depressive disorder, recurrent, mild: Secondary | ICD-10-CM | POA: Diagnosis not present

## 2022-01-29 DIAGNOSIS — F330 Major depressive disorder, recurrent, mild: Secondary | ICD-10-CM | POA: Diagnosis not present

## 2022-02-05 DIAGNOSIS — F330 Major depressive disorder, recurrent, mild: Secondary | ICD-10-CM | POA: Diagnosis not present

## 2022-02-12 DIAGNOSIS — F330 Major depressive disorder, recurrent, mild: Secondary | ICD-10-CM | POA: Diagnosis not present

## 2022-02-19 DIAGNOSIS — F330 Major depressive disorder, recurrent, mild: Secondary | ICD-10-CM | POA: Diagnosis not present

## 2022-02-26 DIAGNOSIS — F330 Major depressive disorder, recurrent, mild: Secondary | ICD-10-CM | POA: Diagnosis not present

## 2022-03-05 DIAGNOSIS — F330 Major depressive disorder, recurrent, mild: Secondary | ICD-10-CM | POA: Diagnosis not present

## 2022-03-12 DIAGNOSIS — F330 Major depressive disorder, recurrent, mild: Secondary | ICD-10-CM | POA: Diagnosis not present

## 2022-03-17 DIAGNOSIS — F330 Major depressive disorder, recurrent, mild: Secondary | ICD-10-CM | POA: Diagnosis not present

## 2022-03-26 DIAGNOSIS — F330 Major depressive disorder, recurrent, mild: Secondary | ICD-10-CM | POA: Diagnosis not present

## 2022-04-02 DIAGNOSIS — F330 Major depressive disorder, recurrent, mild: Secondary | ICD-10-CM | POA: Diagnosis not present

## 2022-04-09 DIAGNOSIS — F330 Major depressive disorder, recurrent, mild: Secondary | ICD-10-CM | POA: Diagnosis not present

## 2022-04-16 DIAGNOSIS — F330 Major depressive disorder, recurrent, mild: Secondary | ICD-10-CM | POA: Diagnosis not present

## 2022-05-07 DIAGNOSIS — F330 Major depressive disorder, recurrent, mild: Secondary | ICD-10-CM | POA: Diagnosis not present

## 2022-05-13 DIAGNOSIS — F330 Major depressive disorder, recurrent, mild: Secondary | ICD-10-CM | POA: Diagnosis not present

## 2022-05-21 DIAGNOSIS — F330 Major depressive disorder, recurrent, mild: Secondary | ICD-10-CM | POA: Diagnosis not present

## 2022-06-04 DIAGNOSIS — F330 Major depressive disorder, recurrent, mild: Secondary | ICD-10-CM | POA: Diagnosis not present

## 2022-06-11 DIAGNOSIS — F330 Major depressive disorder, recurrent, mild: Secondary | ICD-10-CM | POA: Diagnosis not present

## 2022-06-18 DIAGNOSIS — F330 Major depressive disorder, recurrent, mild: Secondary | ICD-10-CM | POA: Diagnosis not present

## 2022-06-25 DIAGNOSIS — F330 Major depressive disorder, recurrent, mild: Secondary | ICD-10-CM | POA: Diagnosis not present

## 2022-07-01 DIAGNOSIS — H00031 Abscess of right upper eyelid: Secondary | ICD-10-CM | POA: Diagnosis not present

## 2022-07-01 DIAGNOSIS — H02841 Edema of right upper eyelid: Secondary | ICD-10-CM | POA: Diagnosis not present

## 2022-07-02 DIAGNOSIS — F330 Major depressive disorder, recurrent, mild: Secondary | ICD-10-CM | POA: Diagnosis not present

## 2022-07-08 DIAGNOSIS — F330 Major depressive disorder, recurrent, mild: Secondary | ICD-10-CM | POA: Diagnosis not present

## 2022-07-11 DIAGNOSIS — Z0000 Encounter for general adult medical examination without abnormal findings: Secondary | ICD-10-CM | POA: Diagnosis not present

## 2022-07-16 DIAGNOSIS — F330 Major depressive disorder, recurrent, mild: Secondary | ICD-10-CM | POA: Diagnosis not present

## 2022-07-23 DIAGNOSIS — F330 Major depressive disorder, recurrent, mild: Secondary | ICD-10-CM | POA: Diagnosis not present

## 2022-07-31 DIAGNOSIS — F330 Major depressive disorder, recurrent, mild: Secondary | ICD-10-CM | POA: Diagnosis not present

## 2022-08-06 DIAGNOSIS — F330 Major depressive disorder, recurrent, mild: Secondary | ICD-10-CM | POA: Diagnosis not present

## 2022-08-13 DIAGNOSIS — F330 Major depressive disorder, recurrent, mild: Secondary | ICD-10-CM | POA: Diagnosis not present

## 2022-08-20 DIAGNOSIS — F330 Major depressive disorder, recurrent, mild: Secondary | ICD-10-CM | POA: Diagnosis not present

## 2022-08-27 DIAGNOSIS — F330 Major depressive disorder, recurrent, mild: Secondary | ICD-10-CM | POA: Diagnosis not present

## 2022-09-10 DIAGNOSIS — F330 Major depressive disorder, recurrent, mild: Secondary | ICD-10-CM | POA: Diagnosis not present

## 2022-09-16 DIAGNOSIS — F99 Mental disorder, not otherwise specified: Secondary | ICD-10-CM | POA: Diagnosis not present

## 2022-09-17 DIAGNOSIS — F330 Major depressive disorder, recurrent, mild: Secondary | ICD-10-CM | POA: Diagnosis not present

## 2022-09-17 DIAGNOSIS — L719 Rosacea, unspecified: Secondary | ICD-10-CM | POA: Diagnosis not present

## 2022-09-24 DIAGNOSIS — F330 Major depressive disorder, recurrent, mild: Secondary | ICD-10-CM | POA: Diagnosis not present

## 2022-10-01 DIAGNOSIS — F330 Major depressive disorder, recurrent, mild: Secondary | ICD-10-CM | POA: Diagnosis not present

## 2022-10-03 DIAGNOSIS — L304 Erythema intertrigo: Secondary | ICD-10-CM | POA: Diagnosis not present

## 2022-10-08 DIAGNOSIS — F330 Major depressive disorder, recurrent, mild: Secondary | ICD-10-CM | POA: Diagnosis not present

## 2022-10-14 DIAGNOSIS — F4320 Adjustment disorder, unspecified: Secondary | ICD-10-CM | POA: Diagnosis not present

## 2022-10-15 DIAGNOSIS — F330 Major depressive disorder, recurrent, mild: Secondary | ICD-10-CM | POA: Diagnosis not present

## 2022-10-22 DIAGNOSIS — F330 Major depressive disorder, recurrent, mild: Secondary | ICD-10-CM | POA: Diagnosis not present

## 2022-11-04 DIAGNOSIS — L719 Rosacea, unspecified: Secondary | ICD-10-CM | POA: Diagnosis not present

## 2022-11-05 DIAGNOSIS — F330 Major depressive disorder, recurrent, mild: Secondary | ICD-10-CM | POA: Diagnosis not present

## 2022-11-11 DIAGNOSIS — F4320 Adjustment disorder, unspecified: Secondary | ICD-10-CM | POA: Diagnosis not present

## 2022-11-12 DIAGNOSIS — F330 Major depressive disorder, recurrent, mild: Secondary | ICD-10-CM | POA: Diagnosis not present

## 2022-11-20 DIAGNOSIS — F330 Major depressive disorder, recurrent, mild: Secondary | ICD-10-CM | POA: Diagnosis not present

## 2022-11-26 DIAGNOSIS — F330 Major depressive disorder, recurrent, mild: Secondary | ICD-10-CM | POA: Diagnosis not present

## 2022-12-11 DIAGNOSIS — F330 Major depressive disorder, recurrent, mild: Secondary | ICD-10-CM | POA: Diagnosis not present

## 2022-12-17 DIAGNOSIS — F330 Major depressive disorder, recurrent, mild: Secondary | ICD-10-CM | POA: Diagnosis not present

## 2022-12-31 DIAGNOSIS — F330 Major depressive disorder, recurrent, mild: Secondary | ICD-10-CM | POA: Diagnosis not present

## 2023-01-07 DIAGNOSIS — F330 Major depressive disorder, recurrent, mild: Secondary | ICD-10-CM | POA: Diagnosis not present

## 2023-01-13 DIAGNOSIS — F4320 Adjustment disorder, unspecified: Secondary | ICD-10-CM | POA: Diagnosis not present

## 2023-01-14 DIAGNOSIS — F330 Major depressive disorder, recurrent, mild: Secondary | ICD-10-CM | POA: Diagnosis not present

## 2023-01-21 DIAGNOSIS — F330 Major depressive disorder, recurrent, mild: Secondary | ICD-10-CM | POA: Diagnosis not present

## 2023-01-28 DIAGNOSIS — F330 Major depressive disorder, recurrent, mild: Secondary | ICD-10-CM | POA: Diagnosis not present

## 2023-02-04 DIAGNOSIS — F330 Major depressive disorder, recurrent, mild: Secondary | ICD-10-CM | POA: Diagnosis not present

## 2023-02-19 DIAGNOSIS — F330 Major depressive disorder, recurrent, mild: Secondary | ICD-10-CM | POA: Diagnosis not present

## 2023-02-23 DIAGNOSIS — Z01419 Encounter for gynecological examination (general) (routine) without abnormal findings: Secondary | ICD-10-CM | POA: Diagnosis not present

## 2023-02-24 DIAGNOSIS — L650 Telogen effluvium: Secondary | ICD-10-CM | POA: Diagnosis not present

## 2023-02-24 DIAGNOSIS — L719 Rosacea, unspecified: Secondary | ICD-10-CM | POA: Diagnosis not present

## 2023-02-26 DIAGNOSIS — F330 Major depressive disorder, recurrent, mild: Secondary | ICD-10-CM | POA: Diagnosis not present

## 2023-03-04 DIAGNOSIS — F330 Major depressive disorder, recurrent, mild: Secondary | ICD-10-CM | POA: Diagnosis not present

## 2023-03-11 DIAGNOSIS — F330 Major depressive disorder, recurrent, mild: Secondary | ICD-10-CM | POA: Diagnosis not present

## 2023-03-12 ENCOUNTER — Encounter (HOSPITAL_BASED_OUTPATIENT_CLINIC_OR_DEPARTMENT_OTHER): Payer: Self-pay | Admitting: Obstetrics & Gynecology

## 2023-03-18 DIAGNOSIS — F330 Major depressive disorder, recurrent, mild: Secondary | ICD-10-CM | POA: Diagnosis not present

## 2023-03-26 DIAGNOSIS — F330 Major depressive disorder, recurrent, mild: Secondary | ICD-10-CM | POA: Diagnosis not present

## 2023-04-02 DIAGNOSIS — F330 Major depressive disorder, recurrent, mild: Secondary | ICD-10-CM | POA: Diagnosis not present

## 2023-04-04 IMAGING — US US PELVIS COMPLETE WITH TRANSVAGINAL
1 series · 13 of 25 positions shown · non-contrast
Comparison: None

CLINICAL DATA: Cervical polyp, LMP 03/08/2021. History of pelvic
floor pain



[Series 1: us pelvic complete with transvaginal · 13 of 81 slices shown]
[im 1/81]
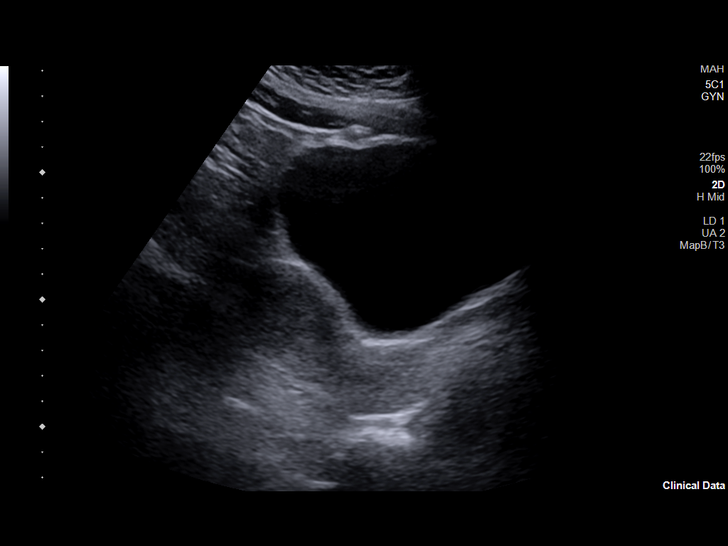
[im 7/81]
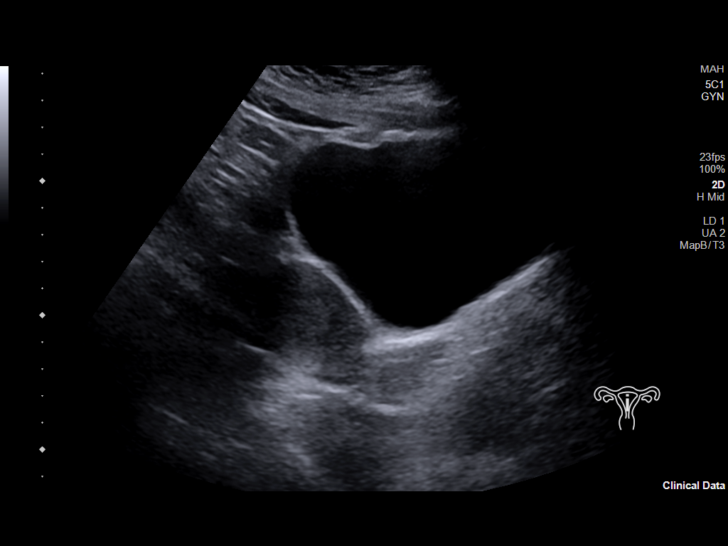
[im 14/81]
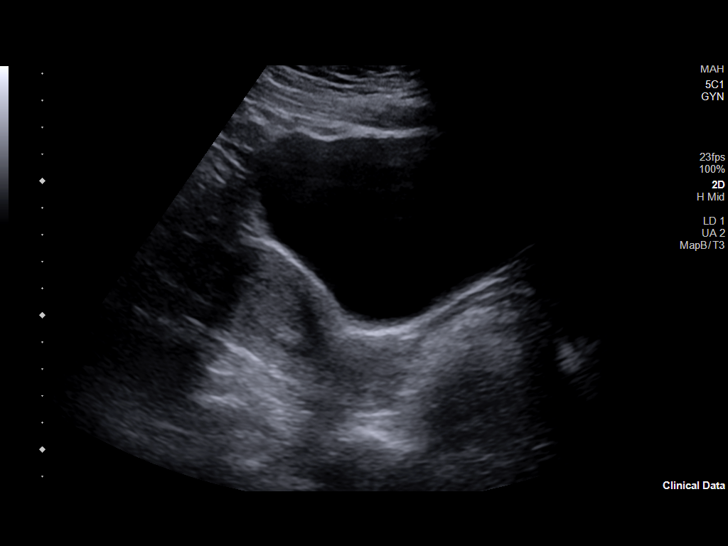
[im 21/81]
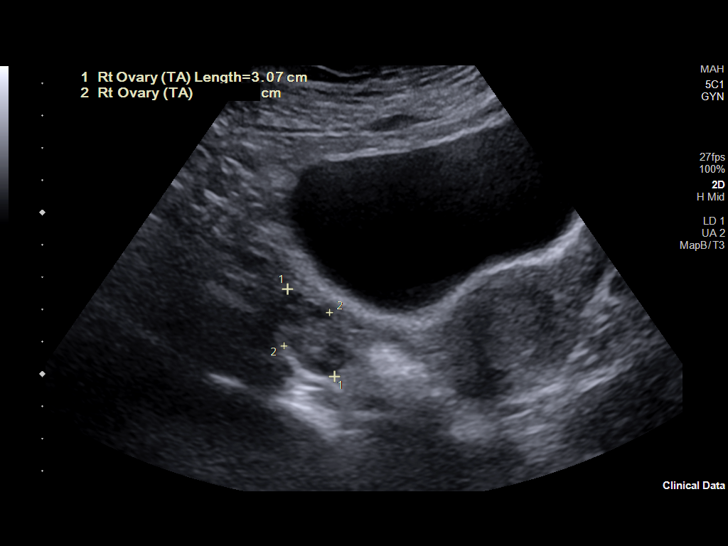
[im 27/81]
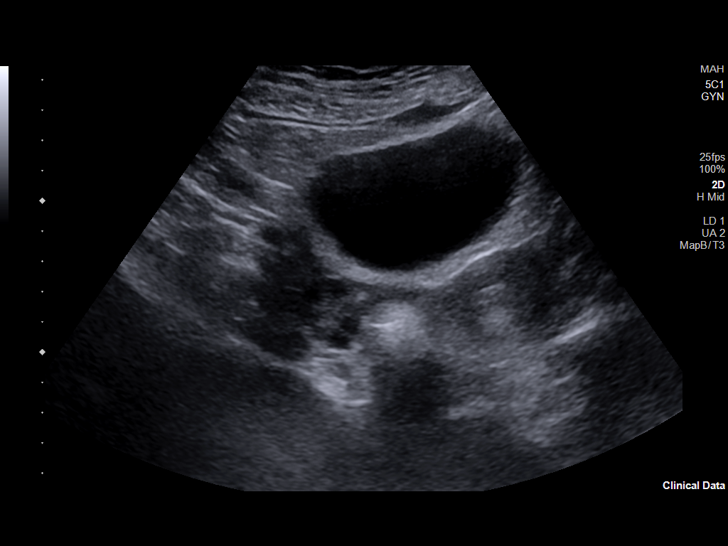
[im 34/81]
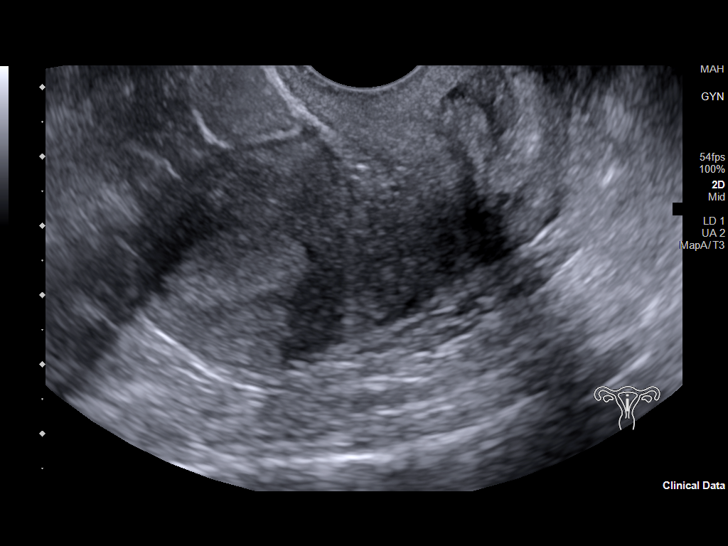
[im 41/81]
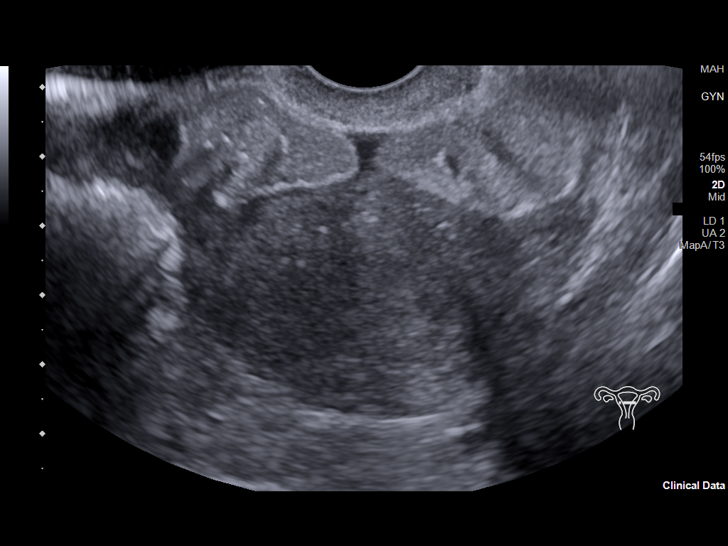
[im 47/81]
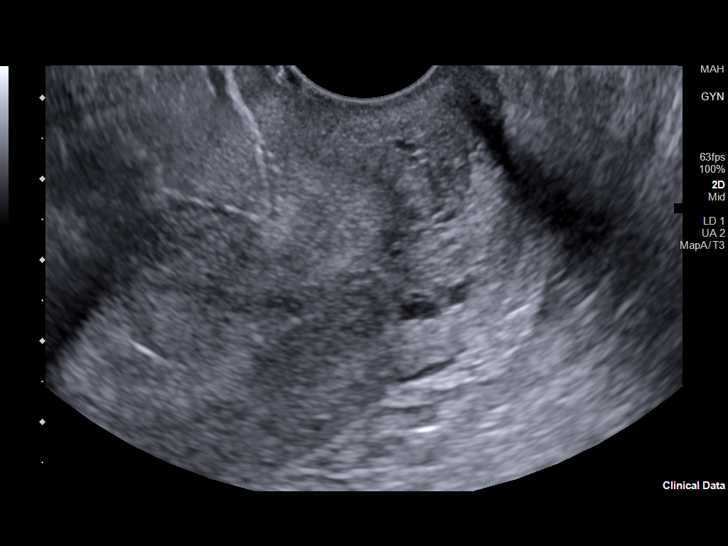
[im 54/81]
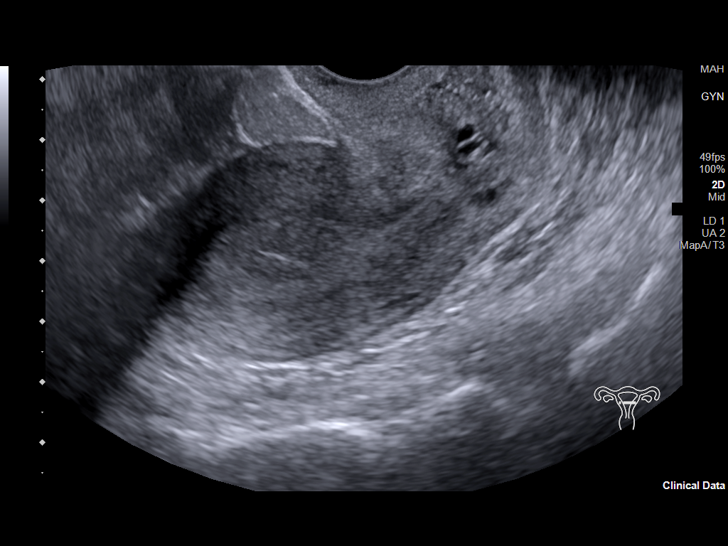
[im 61/81]
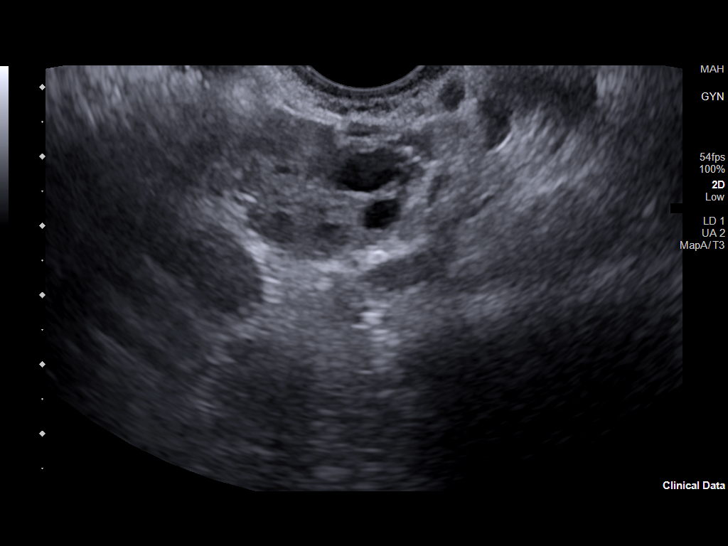
[im 67/81]
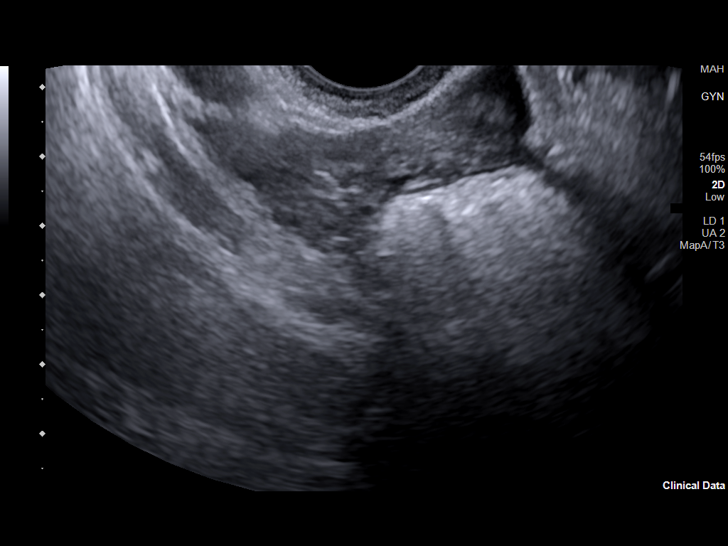
[im 74/81]
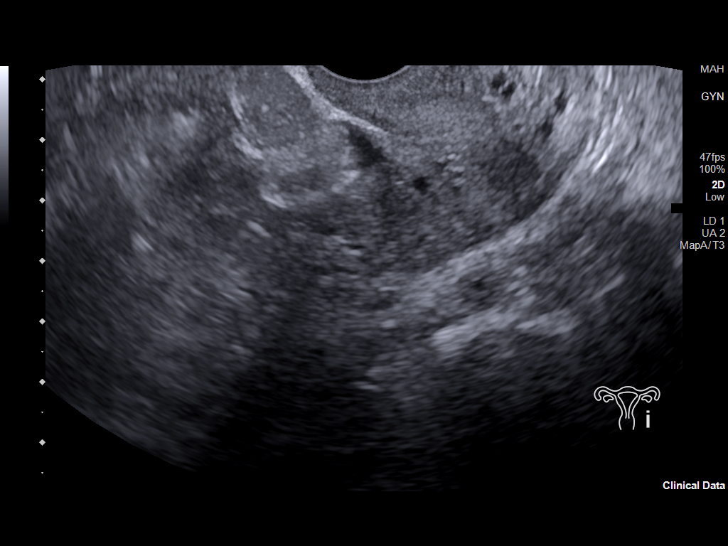
[im 81/81]
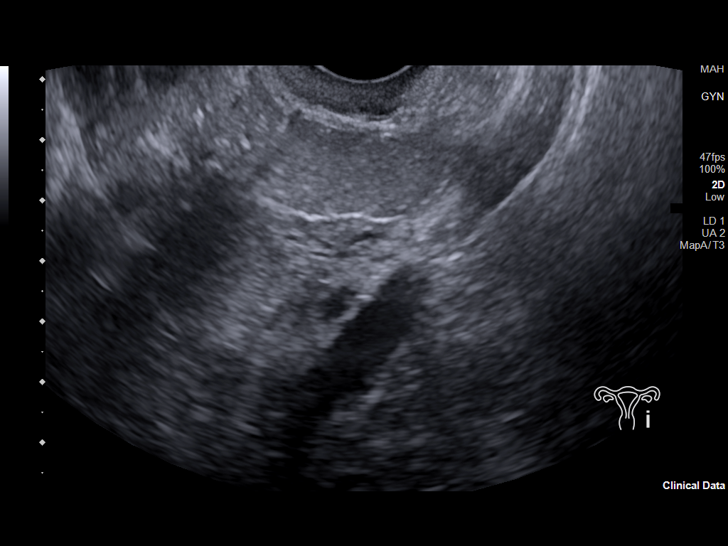

[13 of 25 positions shown; findings below may reference images not displayed]

FINDINGS: Uterus

Measurements: 8.2 x 3.2 x 4.4 cm = volume: 60 mL. Anteverted.
Slightly heterogeneous myometrium. No focal mass.

Endometrium

Thickness: 14 mm. No endometrial fluid or mass. Abnormal appearance
of endocervical canal, distended by a soft tissue nodule 16 x 22 x
19 mm in size, appearing to extend towards the lower uterine
segment. This could represent a cervical polyp or a prolapsed
endometrial polyp on a stalk. Small amount of endocervical fluid is
noted.

Right ovary

Measurements: 4.1 x 1.4 x 2.5 cm = volume: 7.8 mL. Normal morphology
without mass

Left ovary

Not visualized, likely obscured by bowel

Other findings

Trace free pelvic fluid.  No adnexal masses.
IMPRESSION: Nonvisualization of LEFT ovary.

16 x 22 x 19 mm soft tissue nodule within the endocervical canal,
question cervical polyp or prolapsed endometrial polyp on a stalk.

Remainder of exam unremarkable.

## 2023-04-08 DIAGNOSIS — F330 Major depressive disorder, recurrent, mild: Secondary | ICD-10-CM | POA: Diagnosis not present

## 2023-04-16 DIAGNOSIS — F330 Major depressive disorder, recurrent, mild: Secondary | ICD-10-CM | POA: Diagnosis not present

## 2023-04-23 DIAGNOSIS — F330 Major depressive disorder, recurrent, mild: Secondary | ICD-10-CM | POA: Diagnosis not present

## 2023-04-27 DIAGNOSIS — F330 Major depressive disorder, recurrent, mild: Secondary | ICD-10-CM | POA: Diagnosis not present

## 2023-05-06 DIAGNOSIS — F330 Major depressive disorder, recurrent, mild: Secondary | ICD-10-CM | POA: Diagnosis not present

## 2023-05-07 DIAGNOSIS — F330 Major depressive disorder, recurrent, mild: Secondary | ICD-10-CM | POA: Diagnosis not present

## 2023-05-13 DIAGNOSIS — F349 Persistent mood [affective] disorder, unspecified: Secondary | ICD-10-CM | POA: Diagnosis not present

## 2023-05-20 DIAGNOSIS — F349 Persistent mood [affective] disorder, unspecified: Secondary | ICD-10-CM | POA: Diagnosis not present

## 2023-06-04 DIAGNOSIS — F349 Persistent mood [affective] disorder, unspecified: Secondary | ICD-10-CM | POA: Diagnosis not present

## 2023-06-09 ENCOUNTER — Encounter: Payer: Self-pay | Admitting: Physical Therapy

## 2023-06-09 ENCOUNTER — Ambulatory Visit: Payer: BC Managed Care – PPO | Attending: Obstetrics and Gynecology | Admitting: Physical Therapy

## 2023-06-09 ENCOUNTER — Other Ambulatory Visit: Payer: Self-pay

## 2023-06-09 DIAGNOSIS — M62838 Other muscle spasm: Secondary | ICD-10-CM | POA: Insufficient documentation

## 2023-06-09 DIAGNOSIS — R279 Unspecified lack of coordination: Secondary | ICD-10-CM | POA: Diagnosis not present

## 2023-06-09 DIAGNOSIS — M6281 Muscle weakness (generalized): Secondary | ICD-10-CM | POA: Diagnosis not present

## 2023-06-09 DIAGNOSIS — R293 Abnormal posture: Secondary | ICD-10-CM | POA: Insufficient documentation

## 2023-06-09 DIAGNOSIS — N942 Vaginismus: Secondary | ICD-10-CM | POA: Diagnosis not present
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
## 2023-06-10 DIAGNOSIS — Z3400 Encounter for supervision of normal first pregnancy, unspecified trimester: Secondary | ICD-10-CM | POA: Diagnosis not present

## 2023-06-11 DIAGNOSIS — F349 Persistent mood [affective] disorder, unspecified: Secondary | ICD-10-CM | POA: Diagnosis not present

## 2023-06-15 ENCOUNTER — Encounter: Payer: Self-pay | Admitting: Physical Therapy

## 2023-06-15 ENCOUNTER — Ambulatory Visit: Payer: BC Managed Care – PPO | Admitting: Physical Therapy

## 2023-06-15 DIAGNOSIS — R293 Abnormal posture: Secondary | ICD-10-CM

## 2023-06-15 DIAGNOSIS — M62838 Other muscle spasm: Secondary | ICD-10-CM

## 2023-06-15 DIAGNOSIS — R279 Unspecified lack of coordination: Secondary | ICD-10-CM | POA: Diagnosis not present

## 2023-06-15 DIAGNOSIS — M6281 Muscle weakness (generalized): Secondary | ICD-10-CM | POA: Diagnosis not present

## 2023-06-15 DIAGNOSIS — N942 Vaginismus: Secondary | ICD-10-CM | POA: Diagnosis not present
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
## 2023-06-17 DIAGNOSIS — F349 Persistent mood [affective] disorder, unspecified: Secondary | ICD-10-CM | POA: Diagnosis not present

## 2023-06-17 DIAGNOSIS — O26899 Other specified pregnancy related conditions, unspecified trimester: Secondary | ICD-10-CM | POA: Diagnosis not present

## 2023-06-18 ENCOUNTER — Ambulatory Visit: Payer: BC Managed Care – PPO | Admitting: Physical Therapy

## 2023-06-18 ENCOUNTER — Encounter: Payer: Self-pay | Admitting: Physical Therapy

## 2023-06-18 DIAGNOSIS — R279 Unspecified lack of coordination: Secondary | ICD-10-CM | POA: Diagnosis not present

## 2023-06-18 DIAGNOSIS — M62838 Other muscle spasm: Secondary | ICD-10-CM

## 2023-06-18 DIAGNOSIS — R293 Abnormal posture: Secondary | ICD-10-CM | POA: Diagnosis not present

## 2023-06-18 DIAGNOSIS — M6281 Muscle weakness (generalized): Secondary | ICD-10-CM

## 2023-06-18 DIAGNOSIS — N942 Vaginismus: Secondary | ICD-10-CM | POA: Diagnosis not present
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
## 2023-06-24 DIAGNOSIS — F349 Persistent mood [affective] disorder, unspecified: Secondary | ICD-10-CM | POA: Diagnosis not present

## 2023-06-25 ENCOUNTER — Ambulatory Visit: Payer: BC Managed Care – PPO | Admitting: Physical Therapy

## 2023-06-25 DIAGNOSIS — R293 Abnormal posture: Secondary | ICD-10-CM | POA: Diagnosis not present

## 2023-06-25 DIAGNOSIS — R279 Unspecified lack of coordination: Secondary | ICD-10-CM

## 2023-06-25 DIAGNOSIS — M62838 Other muscle spasm: Secondary | ICD-10-CM | POA: Diagnosis not present

## 2023-06-25 DIAGNOSIS — M6281 Muscle weakness (generalized): Secondary | ICD-10-CM | POA: Diagnosis not present

## 2023-06-25 DIAGNOSIS — N942 Vaginismus: Secondary | ICD-10-CM | POA: Diagnosis not present
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
## 2023-06-30 ENCOUNTER — Ambulatory Visit: Payer: BC Managed Care – PPO | Admitting: Physical Therapy

## 2023-06-30 ENCOUNTER — Encounter: Payer: Self-pay | Admitting: Physical Therapy

## 2023-07-01 DIAGNOSIS — F418 Other specified anxiety disorders: Secondary | ICD-10-CM | POA: Diagnosis not present

## 2023-07-01 DIAGNOSIS — F349 Persistent mood [affective] disorder, unspecified: Secondary | ICD-10-CM | POA: Diagnosis not present

## 2023-07-03 ENCOUNTER — Inpatient Hospital Stay (HOSPITAL_COMMUNITY)
Admission: AD | Admit: 2023-07-03 | Discharge: 2023-07-03 | Disposition: A | Discharge status: Home / Self Care | Payer: BC Managed Care – PPO | Attending: Obstetrics & Gynecology | Admitting: Obstetrics & Gynecology

## 2023-07-03 ENCOUNTER — Encounter (HOSPITAL_COMMUNITY): Payer: Self-pay | Admitting: *Deleted

## 2023-07-03 ENCOUNTER — Inpatient Hospital Stay (HOSPITAL_COMMUNITY): Payer: BC Managed Care – PPO

## 2023-07-03 DIAGNOSIS — Z3A09 9 weeks gestation of pregnancy: Secondary | ICD-10-CM | POA: Diagnosis not present

## 2023-07-03 DIAGNOSIS — O26891 Other specified pregnancy related conditions, first trimester: Secondary | ICD-10-CM | POA: Diagnosis not present

## 2023-07-03 DIAGNOSIS — O209 Hemorrhage in early pregnancy, unspecified: Secondary | ICD-10-CM | POA: Insufficient documentation

## 2023-07-03 DIAGNOSIS — R109 Unspecified abdominal pain: Secondary | ICD-10-CM | POA: Diagnosis not present

## 2023-07-03 DIAGNOSIS — O021 Missed abortion: Secondary | ICD-10-CM

## 2023-07-03 DIAGNOSIS — Z3A08 8 weeks gestation of pregnancy: Secondary | ICD-10-CM | POA: Diagnosis not present

## 2023-07-03 HISTORY — DX: Anxiety disorder, unspecified: F41.9

## 2023-07-03 HISTORY — DX: Depression, unspecified: F32.A

## 2023-07-03 LAB — COMPREHENSIVE METABOLIC PANEL
ALT: 18 U/L (ref 0–44)
AST: 19 U/L (ref 15–41)
Albumin: 4 g/dL (ref 3.5–5.0)
Alkaline Phosphatase: 48 U/L (ref 38–126)
Anion gap: 8 (ref 5–15)
BUN: 7 mg/dL (ref 6–20)
CO2: 23 mmol/L (ref 22–32)
Calcium: 9.2 mg/dL (ref 8.9–10.3)
Chloride: 105 mmol/L (ref 98–111)
Creatinine, Ser: 0.53 mg/dL (ref 0.44–1.00)
GFR, Estimated: >60 mL/min (ref >60)
Glucose, Bld: 103 mg/dL — ABNORMAL HIGH (ref 70–99)
Potassium: 3.8 mmol/L (ref 3.5–5.1)
Sodium: 136 mmol/L (ref 135–145)
Total Bilirubin: 0.7 mg/dL (ref 0.0–1.2)
Total Protein: 7.4 g/dL (ref 6.5–8.1)

## 2023-07-03 LAB — CBC
HCT: 40.7 % (ref 36.0–46.0)
Hemoglobin: 13.8 g/dL (ref 12.0–15.0)
MCH: 29.4 pg (ref 26.0–34.0)
MCHC: 33.9 g/dL (ref 30.0–36.0)
MCV: 86.8 fL (ref 80.0–100.0)
Platelets: 224 10*3/uL (ref 150–400)
RBC: 4.69 MIL/uL (ref 3.87–5.11)
RDW: 12.8 % (ref 11.5–15.5)
WBC: 9.8 10*3/uL (ref 4.0–10.5)
nRBC: 0 % (ref 0.0–0.2)

## 2023-07-03 LAB — HCG, QUANTITATIVE, PREGNANCY: hCG, Beta Chain, Quant, S: 10011 m[IU]/mL — ABNORMAL HIGH (ref <5)

## 2023-07-03 LAB — WET PREP, GENITAL
Clue Cells Wet Prep HPF POC: NONE SEEN
Sperm: NONE SEEN
Trich, Wet Prep: NONE SEEN
WBC, Wet Prep HPF POC: >=10 — AB (ref <10)
Yeast Wet Prep HPF POC: NONE SEEN

## 2023-07-03 LAB — ABO/RH: ABO/RH(D): O POS

## 2023-07-03 MED ORDER — OXYCODONE-ACETAMINOPHEN 5-325 MG PO TABS: 1.0000 | ORAL_TABLET | Freq: Four times a day (QID) | ORAL | 0 refills | Status: AC | PRN

## 2023-07-03 MED ORDER — MISOPROSTOL 200 MCG PO TABS: 800.0000 ug | ORAL_TABLET | Freq: Once | ORAL | Status: DC

## 2023-07-03 MED ORDER — IBUPROFEN 800 MG PO TABS: 800.0000 mg | ORAL_TABLET | Freq: Three times a day (TID) | ORAL | 0 refills | Status: AC | PRN

## 2023-07-03 MED ORDER — ONDANSETRON 4 MG PO TBDP: 4.0000 mg | ORAL_TABLET | Freq: Four times a day (QID) | ORAL | 0 refills | Status: AC | PRN

## 2023-07-03 NOTE — Discharge Instructions (Signed)

## 2023-07-03 NOTE — MAU Provider Note (Signed)
History     CSN: [id number]  Arrival date and time: 07/03/23 1916   Event Date/Time   First Provider Initiated Contact with Patient 07/03/23 2013      Chief Complaint  Patient presents with   Vaginal Bleeding   Abdominal Pain    Barbara Whitehead is a 33 y.o. G1P0 at [redacted]w[redacted]d who receives care at [facility].  She presents today for vaginal bleeding and cramping.  Patient states cramping has been ongoing, but has worsened this evening. She rates the pain a 3-4/10 at worse and 1/10. She denies worsening or relieving factors.  She reports onset of blood with wiping that was bright red.  She denies sex in the past 3 days and reports light pink spotting yesterday.   Patient reports [place] 3 weeks ago with [facility].   OB History     Gravida  1   Para  0   Term  0   Preterm  0   AB  0   Living  0      SAB  0   IAB  0   Ectopic  0   Multiple  0   Live Births  0           Past Medical History:  Diagnosis Date   Anxiety    Depression     Past Surgical History:  Procedure Laterality Date   WISDOM TOOTH EXTRACTION  2017    Family History  Problem Relation Age of Onset   Arthritis Mother    Depression Mother    Sarcoidosis Father    Depression Father    Colon cancer Paternal Grandfather     Social History   Tobacco Use   Smoking status: Never   Smokeless tobacco: Never  Vaping Use   Vaping status: Never Used  Substance Use Topics   Alcohol use: Not Currently    Comment: social   Drug use: Not Currently    Allergies: No Known Allergies  Medications Prior to Admission  Medication Sig Dispense Refill Last Dose/Taking   Prenatal Vit-Fe Fumarate-FA (MULTIVITAMIN-PRENATAL) 27-0.8 MG TABS tablet Take 1 tablet by mouth daily at 12 noon.   Taking   sertraline (ZOLOFT) 25 MG tablet Take 25 mg by mouth daily.   Taking   diazepam (VALIUM) 5 MG tablet Please one tablet in vagina nightly as needed (Patient not taking: Reported on 06/09/2023) 15 tablet 0    lidocaine  (XYLOCAINE) 5 % ointment Apply 1 application topically as needed. 35 g 1     Review of Systems  Gastrointestinal:  Positive for abdominal pain (Cramping). Negative for constipation and diarrhea.  Genitourinary:  Positive for vaginal bleeding. Negative for difficulty urinating, dysuria and vaginal discharge.   Physical Exam   Blood pressure (!) 121/93, pulse (!) 103, temperature 98.5 F (36.9 C), resp. rate 18, height 5\' 4"  (1.626 m), weight 70.8 kg.  Physical Exam Vitals reviewed.  Constitutional:      Appearance: Normal appearance. She is well-developed.  Eyes:     Conjunctiva/sclera: Conjunctivae normal.  Cardiovascular:     Rate and Rhythm: Normal rate and regular rhythm.  Pulmonary:     Effort: Pulmonary effort is normal. No respiratory distress.  Musculoskeletal:        General: Normal range of motion.  Skin:    General: Skin is warm and dry.  Neurological:     Mental Status: She is alert and oriented to person, place, and time.  Psychiatric:  Mood and Affect: Mood normal.        Behavior: Behavior normal.    MAU Course  Procedures Results for orders placed or performed during the hospital encounter of 07/03/23 (from the past 24 hours)  Wet prep, genital     Status: Abnormal   Collection Time: 07/03/23  8:11 PM  Result Value Ref Range   Yeast Wet Prep HPF POC NONE SEEN NONE SEEN   Trich, Wet Prep NONE SEEN NONE SEEN   Clue Cells Wet Prep HPF POC NONE SEEN NONE SEEN   WBC, Wet Prep HPF POC >=10 (A) <10   Sperm NONE SEEN   CBC     Status: None   Collection Time: 07/03/23  8:23 PM  Result Value Ref Range   WBC 9.8 4.0 - 10.5 K/uL   RBC 4.69 3.87 - 5.11 MIL/uL   Hemoglobin 13.8 12.0 - 15.0 g/dL   HCT 16.1 09.6 - 04.5 %   MCV 86.8 80.0 - 100.0 fL   MCH 29.4 26.0 - 34.0 pg   MCHC 33.9 30.0 - 36.0 g/dL   RDW 40.9 81.1 - 91.4 %   Platelets 224 150 - 400 K/uL   nRBC 0.0 0.0 - 0.2 %  ABO/Rh     Status: None   Collection Time: 07/03/23  8:23 PM  Result Value  Ref Range   ABO/RH(D) O POS    No rh immune globuloin      NOT A RH IMMUNE GLOBULIN CANDIDATE, PT RH POSITIVE Performed at [facility], [address] [place], [place] [address]   hCG, quantitative, pregnancy     Status: Abnormal   Collection Time: 07/03/23  8:23 PM  Result Value Ref Range   hCG, Beta Chain, Quant, S 10,011 (H) <5 mIU/mL  Comprehensive metabolic panel     Status: Abnormal   Collection Time: 07/03/23  8:23 PM  Result Value Ref Range   Sodium 136 135 - 145 mmol/L   Potassium 3.8 3.5 - 5.1 mmol/L   Chloride 105 98 - 111 mmol/L   CO2 23 22 - 32 mmol/L   Glucose, Bld 103 (H) 70 - 99 mg/dL   BUN 7 6 - 20 mg/dL   Creatinine, Ser 6.21 0.44 - 1.00 mg/dL   Calcium 9.2 8.9 - 30.8 mg/dL   Total Protein 7.4 6.5 - 8.1 g/dL   Albumin 4.0 3.5 - 5.0 g/dL   AST 19 15 - 41 U/L   ALT 18 0 - 44 U/L   Alkaline Phosphatase 48 38 - 126 U/L   Total Bilirubin 0.7 0.0 - 1.2 mg/dL   GFR, Estimated >65 >78 mL/min   Anion gap 8 5 - 15   US OB Comp Less 14 Wks Result Date: 07/03/2023 CLINICAL DATA:  Cramping and vaginal bleeding.  Beta HCG pending. EXAM: OBSTETRIC <14 WK ULTRASOUND TECHNIQUE: Transabdominal ultrasound was performed for evaluation of the gestation as well as the maternal uterus and adnexal regions. COMPARISON:  None Available. FINDINGS: Intrauterine gestational sac: Single Yolk sac:  Not Visualized. Embryo:  Visualized. Cardiac Activity: Not Visualized. Heart Rate: Not visualized. CRL:   20.7 mm   8 w 5 d                  [place] EDC: 02/07/2024 Subchorionic hemorrhage:  None visualized. Maternal uterus/adnexae: The ovaries were not well seen transabdominally. No free fluid in the pelvis. IMPRESSION: Findings meet definitive criteria for failed pregnancy. This follows SRU consensus guidelines: Diagnostic Criteria  for Nonviable Pregnancy Early in the First Trimester. [facility] Med [phone]. Electronically Signed   By: Minerva Fester M.D.   On: 07/03/2023 21:09      MDM Cultures: Wet Prep  Labs: UA, UPT, CBC, CMP, hCG, ABO Ultrasound Prescription Assessment and Plan  33 year old G1P0 at 9.5 weeks Vaginal Bleeding Abdominal Cramping  -Reviewed POC with patient. -Patient questions if she is miscarrying and informed that provider can not say until all results return. -Cultures collected.  -Labs ordered. -Send for [place] and await results.   Cherre Robins 07/03/2023, 8:13 PM   Reassessment (9:31 PM) -Results as above. -Provider to bedside to inform of MAB. -Condolences given.  -Reviewed pregnancy failure options including: 1) Expectant Management 2) Cytotec (Misoprostol) 3) Dilation and curettage -Patient expresses desire to avoid surgery if possible and opts for cytotec dosing. -Extensive discussion regarding risks and benefits of medical management reviewed, including a success rate, need for repeat treatment, potential for surgical intervention, anticipated bleeding onset/duration/flow, pain, when to seek emergency assistance, when to contact office, and follow up.  -Reviewed bleeding precautions and s/s of excessive bleeding including passing of large clots, saturating >2 pads per hour, dizziness/LH, syncope episodes, or tachycardia.  -Reviewed potential side effects of cytotec dosing including cramping, fever, malaise, chills, and diarrhea. -Instructed to contact office and schedule for follow up as appropriate. -Rx for cytotec, ibuprofen, zofran, and percocet sent to pharmacy on file.  -Encouraged to call primary office or return to MAU if symptoms worsen or with the onset of new symptoms. -Discharged to home in stable condition.  Cherre Robins MSN, CNM Advanced Practice Provider, [facility]

## 2023-07-03 NOTE — MAU Note (Signed)
.  Barbara Whitehead is a 33 y.o. at [redacted]w[redacted]d here in MAU reporting: started having lower abd cramping bright red vag bleeding. Stated she had some pink discharge yesterday.  LMP: 04/26/23 Onset of complaint: 1hour Pain score: 3 Vitals:   07/03/23 1951  BP: (!) 121/93  Pulse: (!) 103  Resp: 18  Temp: 98.5 F (36.9 C)     FHT: n/a  Lab orders placed from triage:

## 2023-07-04 ENCOUNTER — Encounter: Payer: Self-pay | Admitting: Physical Therapy

## 2023-07-06 DIAGNOSIS — F349 Persistent mood [affective] disorder, unspecified: Secondary | ICD-10-CM | POA: Diagnosis not present

## 2023-07-06 DIAGNOSIS — O039 Complete or unspecified spontaneous abortion without complication: Secondary | ICD-10-CM | POA: Diagnosis not present

## 2023-07-07 DIAGNOSIS — O039 Complete or unspecified spontaneous abortion without complication: Secondary | ICD-10-CM | POA: Diagnosis not present

## 2023-07-08 DIAGNOSIS — F349 Persistent mood [affective] disorder, unspecified: Secondary | ICD-10-CM | POA: Diagnosis not present

## 2023-07-13 DIAGNOSIS — R739 Hyperglycemia, unspecified: Secondary | ICD-10-CM | POA: Diagnosis not present

## 2023-07-13 DIAGNOSIS — Z0000 Encounter for general adult medical examination without abnormal findings: Secondary | ICD-10-CM | POA: Diagnosis not present

## 2023-07-14 DIAGNOSIS — O039 Complete or unspecified spontaneous abortion without complication: Secondary | ICD-10-CM | POA: Diagnosis not present

## 2023-07-14 DIAGNOSIS — R739 Hyperglycemia, unspecified: Secondary | ICD-10-CM | POA: Diagnosis not present

## 2023-07-14 DIAGNOSIS — F329 Major depressive disorder, single episode, unspecified: Secondary | ICD-10-CM | POA: Diagnosis not present

## 2023-07-15 ENCOUNTER — Encounter: Payer: BC Managed Care – PPO | Admitting: Physical Therapy

## 2023-07-22 DIAGNOSIS — F349 Persistent mood [affective] disorder, unspecified: Secondary | ICD-10-CM | POA: Diagnosis not present

## 2023-07-24 DIAGNOSIS — F349 Persistent mood [affective] disorder, unspecified: Secondary | ICD-10-CM | POA: Diagnosis not present

## 2023-07-27 DIAGNOSIS — O039 Complete or unspecified spontaneous abortion without complication: Secondary | ICD-10-CM | POA: Diagnosis not present

## 2023-07-27 DIAGNOSIS — F349 Persistent mood [affective] disorder, unspecified: Secondary | ICD-10-CM | POA: Diagnosis not present

## 2023-07-29 DIAGNOSIS — F349 Persistent mood [affective] disorder, unspecified: Secondary | ICD-10-CM | POA: Diagnosis not present

## 2023-08-03 DIAGNOSIS — J069 Acute upper respiratory infection, unspecified: Secondary | ICD-10-CM | POA: Diagnosis not present

## 2023-08-03 DIAGNOSIS — R051 Acute cough: Secondary | ICD-10-CM | POA: Diagnosis not present

## 2023-08-03 DIAGNOSIS — O039 Complete or unspecified spontaneous abortion without complication: Secondary | ICD-10-CM | POA: Diagnosis not present

## 2023-08-05 DIAGNOSIS — F349 Persistent mood [affective] disorder, unspecified: Secondary | ICD-10-CM | POA: Diagnosis not present

## 2023-08-07 DIAGNOSIS — F349 Persistent mood [affective] disorder, unspecified: Secondary | ICD-10-CM | POA: Diagnosis not present

## 2023-08-10 ENCOUNTER — Encounter: Payer: Self-pay | Admitting: Physical Therapy

## 2023-08-11 DIAGNOSIS — F349 Persistent mood [affective] disorder, unspecified: Secondary | ICD-10-CM | POA: Diagnosis not present

## 2023-08-12 ENCOUNTER — Ambulatory Visit: Payer: BC Managed Care – PPO | Admitting: Physical Therapy

## 2023-08-12 DIAGNOSIS — F349 Persistent mood [affective] disorder, unspecified: Secondary | ICD-10-CM | POA: Diagnosis not present

## 2023-08-17 DIAGNOSIS — F32A Depression, unspecified: Secondary | ICD-10-CM | POA: Diagnosis not present

## 2023-08-17 DIAGNOSIS — F331 Major depressive disorder, recurrent, moderate: Secondary | ICD-10-CM | POA: Diagnosis not present

## 2023-08-17 DIAGNOSIS — O039 Complete or unspecified spontaneous abortion without complication: Secondary | ICD-10-CM | POA: Diagnosis not present

## 2023-08-18 DIAGNOSIS — R102 Pelvic and perineal pain: Secondary | ICD-10-CM | POA: Diagnosis not present

## 2023-08-19 DIAGNOSIS — F331 Major depressive disorder, recurrent, moderate: Secondary | ICD-10-CM | POA: Diagnosis not present

## 2023-08-20 ENCOUNTER — Encounter: Payer: BC Managed Care – PPO | Admitting: Physical Therapy

## 2023-08-24 DIAGNOSIS — F331 Major depressive disorder, recurrent, moderate: Secondary | ICD-10-CM | POA: Diagnosis not present

## 2023-08-26 DIAGNOSIS — F331 Major depressive disorder, recurrent, moderate: Secondary | ICD-10-CM | POA: Diagnosis not present

## 2023-08-27 ENCOUNTER — Encounter: Payer: BC Managed Care – PPO | Admitting: Physical Therapy

## 2023-08-31 ENCOUNTER — Encounter: Payer: BC Managed Care – PPO | Admitting: Physical Therapy

## 2023-08-31 DIAGNOSIS — F331 Major depressive disorder, recurrent, moderate: Secondary | ICD-10-CM | POA: Diagnosis not present

## 2023-09-02 DIAGNOSIS — F331 Major depressive disorder, recurrent, moderate: Secondary | ICD-10-CM | POA: Diagnosis not present

## 2023-09-07 DIAGNOSIS — F331 Major depressive disorder, recurrent, moderate: Secondary | ICD-10-CM | POA: Diagnosis not present

## 2023-09-08 ENCOUNTER — Encounter: Payer: BC Managed Care – PPO | Admitting: Physical Therapy

## 2023-09-08 DIAGNOSIS — F32A Depression, unspecified: Secondary | ICD-10-CM | POA: Diagnosis not present

## 2023-09-10 DIAGNOSIS — F331 Major depressive disorder, recurrent, moderate: Secondary | ICD-10-CM | POA: Diagnosis not present

## 2023-09-15 ENCOUNTER — Encounter: Payer: BC Managed Care – PPO | Admitting: Physical Therapy

## 2023-09-30 DIAGNOSIS — F331 Major depressive disorder, recurrent, moderate: Secondary | ICD-10-CM | POA: Diagnosis not present

## 2023-10-05 DIAGNOSIS — R110 Nausea: Secondary | ICD-10-CM | POA: Diagnosis not present

## 2023-10-05 DIAGNOSIS — J309 Allergic rhinitis, unspecified: Secondary | ICD-10-CM | POA: Diagnosis not present

## 2023-10-05 DIAGNOSIS — R197 Diarrhea, unspecified: Secondary | ICD-10-CM | POA: Diagnosis not present

## 2023-10-06 ENCOUNTER — Encounter: Payer: BC Managed Care – PPO | Admitting: Physical Therapy

## 2023-10-07 DIAGNOSIS — F331 Major depressive disorder, recurrent, moderate: Secondary | ICD-10-CM | POA: Diagnosis not present

## 2023-10-07 DIAGNOSIS — R197 Diarrhea, unspecified: Secondary | ICD-10-CM | POA: Diagnosis not present

## 2023-10-14 DIAGNOSIS — F331 Major depressive disorder, recurrent, moderate: Secondary | ICD-10-CM | POA: Diagnosis not present

## 2023-10-20 DIAGNOSIS — F418 Other specified anxiety disorders: Secondary | ICD-10-CM | POA: Diagnosis not present

## 2023-10-20 DIAGNOSIS — Z3169 Encounter for other general counseling and advice on procreation: Secondary | ICD-10-CM | POA: Diagnosis not present

## 2023-10-21 DIAGNOSIS — F331 Major depressive disorder, recurrent, moderate: Secondary | ICD-10-CM | POA: Diagnosis not present

## 2023-10-21 DIAGNOSIS — R197 Diarrhea, unspecified: Secondary | ICD-10-CM | POA: Diagnosis not present

## 2023-10-28 DIAGNOSIS — F331 Major depressive disorder, recurrent, moderate: Secondary | ICD-10-CM | POA: Diagnosis not present

## 2023-10-29 DIAGNOSIS — F331 Major depressive disorder, recurrent, moderate: Secondary | ICD-10-CM | POA: Diagnosis not present

## 2023-11-02 DIAGNOSIS — F419 Anxiety disorder, unspecified: Secondary | ICD-10-CM | POA: Diagnosis not present

## 2023-11-02 DIAGNOSIS — F322 Major depressive disorder, single episode, severe without psychotic features: Secondary | ICD-10-CM | POA: Diagnosis not present

## 2023-11-04 DIAGNOSIS — F331 Major depressive disorder, recurrent, moderate: Secondary | ICD-10-CM | POA: Diagnosis not present

## 2023-11-09 DIAGNOSIS — F331 Major depressive disorder, recurrent, moderate: Secondary | ICD-10-CM | POA: Diagnosis not present

## 2023-11-11 DIAGNOSIS — F331 Major depressive disorder, recurrent, moderate: Secondary | ICD-10-CM | POA: Diagnosis not present

## 2023-11-18 DIAGNOSIS — F331 Major depressive disorder, recurrent, moderate: Secondary | ICD-10-CM | POA: Diagnosis not present

## 2023-11-19 DIAGNOSIS — F331 Major depressive disorder, recurrent, moderate: Secondary | ICD-10-CM | POA: Diagnosis not present

## 2023-11-23 DIAGNOSIS — F331 Major depressive disorder, recurrent, moderate: Secondary | ICD-10-CM | POA: Diagnosis not present

## 2023-11-25 DIAGNOSIS — F331 Major depressive disorder, recurrent, moderate: Secondary | ICD-10-CM | POA: Diagnosis not present

## 2023-11-26 DIAGNOSIS — F331 Major depressive disorder, recurrent, moderate: Secondary | ICD-10-CM | POA: Diagnosis not present

## 2023-12-07 DIAGNOSIS — F331 Major depressive disorder, recurrent, moderate: Secondary | ICD-10-CM | POA: Diagnosis not present

## 2023-12-09 DIAGNOSIS — F331 Major depressive disorder, recurrent, moderate: Secondary | ICD-10-CM | POA: Diagnosis not present

## 2023-12-15 DIAGNOSIS — F331 Major depressive disorder, recurrent, moderate: Secondary | ICD-10-CM | POA: Diagnosis not present

## 2023-12-16 DIAGNOSIS — F331 Major depressive disorder, recurrent, moderate: Secondary | ICD-10-CM | POA: Diagnosis not present

## 2023-12-21 DIAGNOSIS — F3341 Major depressive disorder, recurrent, in partial remission: Secondary | ICD-10-CM | POA: Diagnosis not present

## 2023-12-21 DIAGNOSIS — F331 Major depressive disorder, recurrent, moderate: Secondary | ICD-10-CM | POA: Diagnosis not present

## 2023-12-23 DIAGNOSIS — F331 Major depressive disorder, recurrent, moderate: Secondary | ICD-10-CM | POA: Diagnosis not present

## 2023-12-23 DIAGNOSIS — F332 Major depressive disorder, recurrent severe without psychotic features: Secondary | ICD-10-CM | POA: Diagnosis not present

## 2023-12-24 DIAGNOSIS — F4321 Adjustment disorder with depressed mood: Secondary | ICD-10-CM | POA: Diagnosis not present

## 2023-12-24 DIAGNOSIS — F418 Other specified anxiety disorders: Secondary | ICD-10-CM | POA: Diagnosis not present

## 2023-12-28 DIAGNOSIS — F331 Major depressive disorder, recurrent, moderate: Secondary | ICD-10-CM | POA: Diagnosis not present

## 2023-12-29 DIAGNOSIS — F331 Major depressive disorder, recurrent, moderate: Secondary | ICD-10-CM | POA: Diagnosis not present

## 2023-12-30 DIAGNOSIS — F331 Major depressive disorder, recurrent, moderate: Secondary | ICD-10-CM | POA: Diagnosis not present

## 2024-01-04 DIAGNOSIS — F331 Major depressive disorder, recurrent, moderate: Secondary | ICD-10-CM | POA: Diagnosis not present

## 2024-01-05 DIAGNOSIS — F331 Major depressive disorder, recurrent, moderate: Secondary | ICD-10-CM | POA: Diagnosis not present

## 2024-01-06 DIAGNOSIS — F331 Major depressive disorder, recurrent, moderate: Secondary | ICD-10-CM | POA: Diagnosis not present

## 2024-01-07 DIAGNOSIS — F332 Major depressive disorder, recurrent severe without psychotic features: Secondary | ICD-10-CM | POA: Diagnosis not present

## 2024-01-08 DIAGNOSIS — F332 Major depressive disorder, recurrent severe without psychotic features: Secondary | ICD-10-CM | POA: Diagnosis not present

## 2024-01-11 DIAGNOSIS — F332 Major depressive disorder, recurrent severe without psychotic features: Secondary | ICD-10-CM | POA: Diagnosis not present

## 2024-01-11 DIAGNOSIS — F331 Major depressive disorder, recurrent, moderate: Secondary | ICD-10-CM | POA: Diagnosis not present

## 2024-01-12 DIAGNOSIS — F332 Major depressive disorder, recurrent severe without psychotic features: Secondary | ICD-10-CM | POA: Diagnosis not present

## 2024-01-12 DIAGNOSIS — F331 Major depressive disorder, recurrent, moderate: Secondary | ICD-10-CM | POA: Diagnosis not present

## 2024-01-13 DIAGNOSIS — F332 Major depressive disorder, recurrent severe without psychotic features: Secondary | ICD-10-CM | POA: Diagnosis not present

## 2024-01-13 DIAGNOSIS — F331 Major depressive disorder, recurrent, moderate: Secondary | ICD-10-CM | POA: Diagnosis not present

## 2024-01-14 DIAGNOSIS — F332 Major depressive disorder, recurrent severe without psychotic features: Secondary | ICD-10-CM | POA: Diagnosis not present

## 2024-01-15 DIAGNOSIS — F332 Major depressive disorder, recurrent severe without psychotic features: Secondary | ICD-10-CM | POA: Diagnosis not present

## 2024-01-18 DIAGNOSIS — F332 Major depressive disorder, recurrent severe without psychotic features: Secondary | ICD-10-CM | POA: Diagnosis not present

## 2024-01-18 DIAGNOSIS — F331 Major depressive disorder, recurrent, moderate: Secondary | ICD-10-CM | POA: Diagnosis not present

## 2024-01-19 DIAGNOSIS — F332 Major depressive disorder, recurrent severe without psychotic features: Secondary | ICD-10-CM | POA: Diagnosis not present

## 2024-01-20 DIAGNOSIS — F332 Major depressive disorder, recurrent severe without psychotic features: Secondary | ICD-10-CM | POA: Diagnosis not present

## 2024-01-20 DIAGNOSIS — F331 Major depressive disorder, recurrent, moderate: Secondary | ICD-10-CM | POA: Diagnosis not present

## 2024-01-21 DIAGNOSIS — F331 Major depressive disorder, recurrent, moderate: Secondary | ICD-10-CM | POA: Diagnosis not present

## 2024-01-21 DIAGNOSIS — F332 Major depressive disorder, recurrent severe without psychotic features: Secondary | ICD-10-CM | POA: Diagnosis not present

## 2024-01-22 DIAGNOSIS — F332 Major depressive disorder, recurrent severe without psychotic features: Secondary | ICD-10-CM | POA: Diagnosis not present

## 2024-01-25 DIAGNOSIS — F331 Major depressive disorder, recurrent, moderate: Secondary | ICD-10-CM | POA: Diagnosis not present

## 2024-01-25 DIAGNOSIS — F332 Major depressive disorder, recurrent severe without psychotic features: Secondary | ICD-10-CM | POA: Diagnosis not present

## 2024-01-26 DIAGNOSIS — F332 Major depressive disorder, recurrent severe without psychotic features: Secondary | ICD-10-CM | POA: Diagnosis not present

## 2024-01-26 DIAGNOSIS — F331 Major depressive disorder, recurrent, moderate: Secondary | ICD-10-CM | POA: Diagnosis not present

## 2024-01-27 DIAGNOSIS — F331 Major depressive disorder, recurrent, moderate: Secondary | ICD-10-CM | POA: Diagnosis not present

## 2024-01-27 DIAGNOSIS — F332 Major depressive disorder, recurrent severe without psychotic features: Secondary | ICD-10-CM | POA: Diagnosis not present

## 2024-01-28 DIAGNOSIS — F331 Major depressive disorder, recurrent, moderate: Secondary | ICD-10-CM | POA: Diagnosis not present

## 2024-01-28 DIAGNOSIS — F332 Major depressive disorder, recurrent severe without psychotic features: Secondary | ICD-10-CM | POA: Diagnosis not present

## 2024-01-29 DIAGNOSIS — F332 Major depressive disorder, recurrent severe without psychotic features: Secondary | ICD-10-CM | POA: Diagnosis not present

## 2024-02-02 DIAGNOSIS — F332 Major depressive disorder, recurrent severe without psychotic features: Secondary | ICD-10-CM | POA: Diagnosis not present

## 2024-02-02 DIAGNOSIS — F331 Major depressive disorder, recurrent, moderate: Secondary | ICD-10-CM | POA: Diagnosis not present

## 2024-02-03 DIAGNOSIS — F332 Major depressive disorder, recurrent severe without psychotic features: Secondary | ICD-10-CM | POA: Diagnosis not present

## 2024-02-03 DIAGNOSIS — F331 Major depressive disorder, recurrent, moderate: Secondary | ICD-10-CM | POA: Diagnosis not present

## 2024-02-04 DIAGNOSIS — F332 Major depressive disorder, recurrent severe without psychotic features: Secondary | ICD-10-CM | POA: Diagnosis not present

## 2024-02-04 DIAGNOSIS — F331 Major depressive disorder, recurrent, moderate: Secondary | ICD-10-CM | POA: Diagnosis not present

## 2024-02-05 DIAGNOSIS — F332 Major depressive disorder, recurrent severe without psychotic features: Secondary | ICD-10-CM | POA: Diagnosis not present

## 2024-02-08 DIAGNOSIS — F332 Major depressive disorder, recurrent severe without psychotic features: Secondary | ICD-10-CM | POA: Diagnosis not present

## 2024-02-08 DIAGNOSIS — F331 Major depressive disorder, recurrent, moderate: Secondary | ICD-10-CM | POA: Diagnosis not present

## 2024-02-09 DIAGNOSIS — F331 Major depressive disorder, recurrent, moderate: Secondary | ICD-10-CM | POA: Diagnosis not present

## 2024-02-09 DIAGNOSIS — F332 Major depressive disorder, recurrent severe without psychotic features: Secondary | ICD-10-CM | POA: Diagnosis not present

## 2024-02-10 DIAGNOSIS — F331 Major depressive disorder, recurrent, moderate: Secondary | ICD-10-CM | POA: Diagnosis not present

## 2024-02-10 DIAGNOSIS — F332 Major depressive disorder, recurrent severe without psychotic features: Secondary | ICD-10-CM | POA: Diagnosis not present

## 2024-02-11 DIAGNOSIS — F332 Major depressive disorder, recurrent severe without psychotic features: Secondary | ICD-10-CM | POA: Diagnosis not present

## 2024-02-12 DIAGNOSIS — F332 Major depressive disorder, recurrent severe without psychotic features: Secondary | ICD-10-CM | POA: Diagnosis not present

## 2024-02-15 DIAGNOSIS — F332 Major depressive disorder, recurrent severe without psychotic features: Secondary | ICD-10-CM | POA: Diagnosis not present

## 2024-02-16 DIAGNOSIS — F332 Major depressive disorder, recurrent severe without psychotic features: Secondary | ICD-10-CM | POA: Diagnosis not present

## 2024-02-17 DIAGNOSIS — F332 Major depressive disorder, recurrent severe without psychotic features: Secondary | ICD-10-CM | POA: Diagnosis not present

## 2024-02-17 DIAGNOSIS — F331 Major depressive disorder, recurrent, moderate: Secondary | ICD-10-CM | POA: Diagnosis not present

## 2024-02-18 DIAGNOSIS — L719 Rosacea, unspecified: Secondary | ICD-10-CM | POA: Diagnosis not present

## 2024-02-18 DIAGNOSIS — F331 Major depressive disorder, recurrent, moderate: Secondary | ICD-10-CM | POA: Diagnosis not present

## 2024-02-18 DIAGNOSIS — F332 Major depressive disorder, recurrent severe without psychotic features: Secondary | ICD-10-CM | POA: Diagnosis not present

## 2024-02-22 DIAGNOSIS — F331 Major depressive disorder, recurrent, moderate: Secondary | ICD-10-CM | POA: Diagnosis not present

## 2024-02-23 DIAGNOSIS — F332 Major depressive disorder, recurrent severe without psychotic features: Secondary | ICD-10-CM | POA: Diagnosis not present

## 2024-02-23 DIAGNOSIS — F4320 Adjustment disorder, unspecified: Secondary | ICD-10-CM | POA: Diagnosis not present

## 2024-02-23 DIAGNOSIS — F418 Other specified anxiety disorders: Secondary | ICD-10-CM | POA: Diagnosis not present

## 2024-02-24 DIAGNOSIS — F332 Major depressive disorder, recurrent severe without psychotic features: Secondary | ICD-10-CM | POA: Diagnosis not present

## 2024-02-24 DIAGNOSIS — F331 Major depressive disorder, recurrent, moderate: Secondary | ICD-10-CM | POA: Diagnosis not present

## 2024-02-25 DIAGNOSIS — F332 Major depressive disorder, recurrent severe without psychotic features: Secondary | ICD-10-CM | POA: Diagnosis not present

## 2024-02-25 DIAGNOSIS — F331 Major depressive disorder, recurrent, moderate: Secondary | ICD-10-CM | POA: Diagnosis not present

## 2024-02-29 DIAGNOSIS — F331 Major depressive disorder, recurrent, moderate: Secondary | ICD-10-CM | POA: Diagnosis not present

## 2024-03-01 DIAGNOSIS — F331 Major depressive disorder, recurrent, moderate: Secondary | ICD-10-CM | POA: Diagnosis not present

## 2024-03-01 DIAGNOSIS — F332 Major depressive disorder, recurrent severe without psychotic features: Secondary | ICD-10-CM | POA: Diagnosis not present

## 2024-03-03 DIAGNOSIS — F332 Major depressive disorder, recurrent severe without psychotic features: Secondary | ICD-10-CM | POA: Diagnosis not present

## 2024-03-04 DIAGNOSIS — F332 Major depressive disorder, recurrent severe without psychotic features: Secondary | ICD-10-CM | POA: Diagnosis not present

## 2024-03-09 DIAGNOSIS — F322 Major depressive disorder, single episode, severe without psychotic features: Secondary | ICD-10-CM | POA: Diagnosis not present

## 2024-03-24 DIAGNOSIS — F4312 Post-traumatic stress disorder, chronic: Secondary | ICD-10-CM | POA: Diagnosis not present

## 2024-03-28 DIAGNOSIS — F4312 Post-traumatic stress disorder, chronic: Secondary | ICD-10-CM | POA: Diagnosis not present

## 2024-03-29 DIAGNOSIS — F4312 Post-traumatic stress disorder, chronic: Secondary | ICD-10-CM | POA: Diagnosis not present

## 2024-03-30 DIAGNOSIS — F4312 Post-traumatic stress disorder, chronic: Secondary | ICD-10-CM | POA: Diagnosis not present

## 2024-03-31 DIAGNOSIS — F4312 Post-traumatic stress disorder, chronic: Secondary | ICD-10-CM | POA: Diagnosis not present

## 2024-04-04 DIAGNOSIS — F4312 Post-traumatic stress disorder, chronic: Secondary | ICD-10-CM | POA: Diagnosis not present

## 2024-04-05 DIAGNOSIS — F4312 Post-traumatic stress disorder, chronic: Secondary | ICD-10-CM | POA: Diagnosis not present

## 2024-04-06 DIAGNOSIS — F4312 Post-traumatic stress disorder, chronic: Secondary | ICD-10-CM | POA: Diagnosis not present

## 2024-04-07 DIAGNOSIS — F4312 Post-traumatic stress disorder, chronic: Secondary | ICD-10-CM | POA: Diagnosis not present

## 2024-04-11 DIAGNOSIS — F411 Generalized anxiety disorder: Secondary | ICD-10-CM | POA: Diagnosis not present

## 2024-04-11 DIAGNOSIS — F4312 Post-traumatic stress disorder, chronic: Secondary | ICD-10-CM | POA: Diagnosis not present

## 2024-04-12 DIAGNOSIS — F4312 Post-traumatic stress disorder, chronic: Secondary | ICD-10-CM | POA: Diagnosis not present

## 2024-04-13 DIAGNOSIS — F4312 Post-traumatic stress disorder, chronic: Secondary | ICD-10-CM | POA: Diagnosis not present

## 2024-04-14 DIAGNOSIS — F4312 Post-traumatic stress disorder, chronic: Secondary | ICD-10-CM | POA: Diagnosis not present

## 2024-04-18 DIAGNOSIS — F4312 Post-traumatic stress disorder, chronic: Secondary | ICD-10-CM | POA: Diagnosis not present

## 2024-04-18 DIAGNOSIS — F332 Major depressive disorder, recurrent severe without psychotic features: Secondary | ICD-10-CM | POA: Diagnosis not present

## 2024-04-19 DIAGNOSIS — F4312 Post-traumatic stress disorder, chronic: Secondary | ICD-10-CM | POA: Diagnosis not present

## 2024-04-20 DIAGNOSIS — F4312 Post-traumatic stress disorder, chronic: Secondary | ICD-10-CM | POA: Diagnosis not present

## 2024-04-21 DIAGNOSIS — F4312 Post-traumatic stress disorder, chronic: Secondary | ICD-10-CM | POA: Diagnosis not present

## 2024-04-26 DIAGNOSIS — F4312 Post-traumatic stress disorder, chronic: Secondary | ICD-10-CM | POA: Diagnosis not present

## 2024-04-27 DIAGNOSIS — F4312 Post-traumatic stress disorder, chronic: Secondary | ICD-10-CM | POA: Diagnosis not present

## 2024-05-02 DIAGNOSIS — F4312 Post-traumatic stress disorder, chronic: Secondary | ICD-10-CM | POA: Diagnosis not present

## 2024-05-03 DIAGNOSIS — F4312 Post-traumatic stress disorder, chronic: Secondary | ICD-10-CM | POA: Diagnosis not present

## 2024-05-03 DIAGNOSIS — F332 Major depressive disorder, recurrent severe without psychotic features: Secondary | ICD-10-CM | POA: Diagnosis not present

## 2024-05-04 DIAGNOSIS — F4312 Post-traumatic stress disorder, chronic: Secondary | ICD-10-CM | POA: Diagnosis not present

## 2024-05-05 DIAGNOSIS — F4312 Post-traumatic stress disorder, chronic: Secondary | ICD-10-CM | POA: Diagnosis not present

## 2024-05-05 DIAGNOSIS — F332 Major depressive disorder, recurrent severe without psychotic features: Secondary | ICD-10-CM | POA: Diagnosis not present

## 2024-05-09 DIAGNOSIS — F4312 Post-traumatic stress disorder, chronic: Secondary | ICD-10-CM | POA: Diagnosis not present

## 2024-05-10 DIAGNOSIS — F332 Major depressive disorder, recurrent severe without psychotic features: Secondary | ICD-10-CM | POA: Diagnosis not present

## 2024-05-10 DIAGNOSIS — F4312 Post-traumatic stress disorder, chronic: Secondary | ICD-10-CM | POA: Diagnosis not present

## 2024-05-11 DIAGNOSIS — F4312 Post-traumatic stress disorder, chronic: Secondary | ICD-10-CM | POA: Diagnosis not present

## 2024-05-12 DIAGNOSIS — F4312 Post-traumatic stress disorder, chronic: Secondary | ICD-10-CM | POA: Diagnosis not present

## 2024-05-12 DIAGNOSIS — F332 Major depressive disorder, recurrent severe without psychotic features: Secondary | ICD-10-CM | POA: Diagnosis not present

## 2024-05-16 DIAGNOSIS — F4312 Post-traumatic stress disorder, chronic: Secondary | ICD-10-CM | POA: Diagnosis not present

## 2024-05-16 DIAGNOSIS — F332 Major depressive disorder, recurrent severe without psychotic features: Secondary | ICD-10-CM | POA: Diagnosis not present

## 2024-05-17 DIAGNOSIS — F332 Major depressive disorder, recurrent severe without psychotic features: Secondary | ICD-10-CM | POA: Diagnosis not present

## 2024-05-17 DIAGNOSIS — F4312 Post-traumatic stress disorder, chronic: Secondary | ICD-10-CM | POA: Diagnosis not present

## 2024-05-18 DIAGNOSIS — F4312 Post-traumatic stress disorder, chronic: Secondary | ICD-10-CM | POA: Diagnosis not present

## 2024-05-19 DIAGNOSIS — F4312 Post-traumatic stress disorder, chronic: Secondary | ICD-10-CM | POA: Diagnosis not present

## 2024-05-19 DIAGNOSIS — F332 Major depressive disorder, recurrent severe without psychotic features: Secondary | ICD-10-CM | POA: Diagnosis not present

## 2024-05-23 DIAGNOSIS — F4312 Post-traumatic stress disorder, chronic: Secondary | ICD-10-CM | POA: Diagnosis not present

## 2024-05-23 DIAGNOSIS — F332 Major depressive disorder, recurrent severe without psychotic features: Secondary | ICD-10-CM | POA: Diagnosis not present

## 2024-05-24 DIAGNOSIS — F4312 Post-traumatic stress disorder, chronic: Secondary | ICD-10-CM | POA: Diagnosis not present

## 2024-05-24 DIAGNOSIS — F332 Major depressive disorder, recurrent severe without psychotic features: Secondary | ICD-10-CM | POA: Diagnosis not present

## 2024-05-27 DIAGNOSIS — F332 Major depressive disorder, recurrent severe without psychotic features: Secondary | ICD-10-CM | POA: Diagnosis not present

## 2024-05-30 DIAGNOSIS — F4312 Post-traumatic stress disorder, chronic: Secondary | ICD-10-CM | POA: Diagnosis not present

## 2024-05-31 DIAGNOSIS — F4312 Post-traumatic stress disorder, chronic: Secondary | ICD-10-CM | POA: Diagnosis not present

## 2024-06-01 DIAGNOSIS — F4312 Post-traumatic stress disorder, chronic: Secondary | ICD-10-CM | POA: Diagnosis not present

## 2024-06-01 DIAGNOSIS — F332 Major depressive disorder, recurrent severe without psychotic features: Secondary | ICD-10-CM | POA: Diagnosis not present
